# Patient Record
Sex: Male | Born: 1985 | Race: White | Hispanic: No | Marital: Married | Smoking: Never smoker
Health system: Southern US, Community
[De-identification: ages and names within clinical notes are randomized; demographics above are authoritative.]

---

## 2011-05-23 ENCOUNTER — Emergency Department (HOSPITAL_COMMUNITY)
Admission: EM | Admit: 2011-05-23 | Discharge: 2011-05-23 | Disposition: A | Discharge status: Home / Self Care | Payer: Self-pay | Attending: Emergency Medicine | Admitting: Emergency Medicine

## 2011-05-23 ENCOUNTER — Encounter: Payer: Self-pay | Admitting: *Deleted

## 2011-05-23 DIAGNOSIS — R229 Localized swelling, mass and lump, unspecified: Secondary | ICD-10-CM | POA: Insufficient documentation

## 2011-05-23 DIAGNOSIS — L03019 Cellulitis of unspecified finger: Secondary | ICD-10-CM | POA: Insufficient documentation

## 2011-05-23 DIAGNOSIS — IMO0002 Reserved for concepts with insufficient information to code with codable children: Secondary | ICD-10-CM

## 2011-05-23 DIAGNOSIS — M79609 Pain in unspecified limb: Secondary | ICD-10-CM | POA: Insufficient documentation

## 2011-05-23 MED ORDER — IBUPROFEN 800 MG PO TABS
ORAL_TABLET | ORAL | Status: AC
  Administered 2011-05-23: 800 mg
  Filled 2011-05-23: qty 1

## 2011-05-23 NOTE — ED Provider Notes (Signed)
Medical screening examination/treatment/procedure(s) were performed by non-physician practitioner and as supervising physician I was immediately available for consultation/collaboration.  Reneka Nebergall M Cilicia Borden, MD 05/23/11 0726 

## 2011-05-23 NOTE — ED Provider Notes (Signed)
History     CSN: [id number] Arrival date & time: 05/23/2011  2:04 AM   First MD Initiated Contact with Patient 05/23/11 0207      Chief Complaint  Patient presents with  . Finger Injury    HPI  History provided by the patient and significant other. Patient complains of swelling and pain around the nail of his right index finger that began earlier today. The patient denies similar symptoms in the past. Symptoms came on gradually throughout the day. Pain is a constant ache that is worse with palpation or pressure. Patient has not taken or done anything for the pain. Patient denies drainage or bleeding. Patient does admit to biting fingernails. She has no other significant past medical history.   History reviewed. No pertinent past medical history.  History reviewed. No pertinent past surgical history.  History reviewed. No pertinent family history.  History  Substance Use Topics  . Smoking status: Never Smoker   . Smokeless tobacco: Not on file  . Alcohol Use: No      Review of Systems  Constitutional: Negative for fever and chills.  All other systems reviewed and are negative.    Allergies  Review of patient's allergies indicates no known allergies.  Home Medications   Current Outpatient Rx  Name Route Sig Dispense Refill  . IBUPROFEN 200 MG PO TABS Oral Take 800 mg by mouth every 6 (six) hours as needed. For pain       BP 148/84  Pulse 110  Temp(Src) 97.7 F (36.5 C) (Oral)  Resp 18  SpO2 97%  Physical Exam  Nursing note and vitals reviewed. Constitutional: He is oriented to person, place, and time. He appears well-developed and well-nourished. No distress.  HENT:  Head: Normocephalic.  Cardiovascular: Normal rate, regular rhythm and normal heart sounds.   Pulmonary/Chest: Effort normal.  Musculoskeletal:       Paronychia about right index finger. Range of motion of finger no erythematous streaks or circumferential swelling. No pain or tenderness along  the flexor surface of finger.  Neurological: He is alert and oriented to person, place, and time.  Skin: Skin is warm. No rash noted.  Psychiatric: He has a normal mood and affect. His behavior is normal.    ED Course  Procedures (including critical care time)  INCISION AND DRAINAGE Performed by: Angus Seller Consent: Verbal consent obtained. Risks and benefits: risks, benefits and alternatives were discussed Type: Paronychia   Body area: Right index finger  Anesthesia: None  Procedure: I&D with 18-gauge needle   Drainage: purulent  Patient tolerance: Patient tolerated the procedure well with no immediate complications.     1. Paronychia       MDM  Patient seen and evaluated. Patient in no acute distress. HM presents with paronychia of right index finger. Paronychia was drained with 18-gauge needle successfully. Patient encouraged to use warm soaks and gentle massage at home. We'll discharge at this time.       Angus Seller, [place] 05/23/11 [phone]

## 2011-05-23 NOTE — ED Notes (Signed)
Patient with right index finger infection at the nail bed

## 2014-10-22 ENCOUNTER — Encounter (HOSPITAL_COMMUNITY): Payer: Self-pay

## 2014-10-22 ENCOUNTER — Emergency Department (HOSPITAL_COMMUNITY): Payer: BLUE CROSS/BLUE SHIELD

## 2014-10-22 ENCOUNTER — Emergency Department (HOSPITAL_COMMUNITY)
Admission: EM | Admit: 2014-10-22 | Discharge: 2014-10-22 | Disposition: A | Discharge status: Home / Self Care | Payer: BLUE CROSS/BLUE SHIELD | Attending: Emergency Medicine | Admitting: Emergency Medicine

## 2014-10-22 DIAGNOSIS — Y998 Other external cause status: Secondary | ICD-10-CM | POA: Insufficient documentation

## 2014-10-22 DIAGNOSIS — S0990XA Unspecified injury of head, initial encounter: Secondary | ICD-10-CM | POA: Diagnosis present

## 2014-10-22 DIAGNOSIS — Y9389 Activity, other specified: Secondary | ICD-10-CM | POA: Diagnosis not present

## 2014-10-22 DIAGNOSIS — Y9289 Other specified places as the place of occurrence of the external cause: Secondary | ICD-10-CM | POA: Diagnosis not present

## 2014-10-22 DIAGNOSIS — W208XXA Other cause of strike by thrown, projected or falling object, initial encounter: Secondary | ICD-10-CM | POA: Diagnosis not present

## 2014-10-22 DIAGNOSIS — F0781 Postconcussional syndrome: Secondary | ICD-10-CM

## 2014-10-22 DIAGNOSIS — S060X9A Concussion with loss of consciousness of unspecified duration, initial encounter: Secondary | ICD-10-CM | POA: Diagnosis not present

## 2014-10-22 DIAGNOSIS — S0001XA Abrasion of scalp, initial encounter: Secondary | ICD-10-CM | POA: Diagnosis not present

## 2014-10-22 MED ORDER — NAPROXEN 500 MG PO TABS: 500.0000 mg | ORAL_TABLET | Freq: Two times a day (BID) | ORAL | Status: AC

## 2014-10-22 MED ORDER — ONDANSETRON 8 MG PO TBDP: 8.0000 mg | ORAL_TABLET | Freq: Three times a day (TID) | ORAL | Status: DC | PRN

## 2014-10-22 MED ORDER — IBUPROFEN 400 MG PO TABS
600.0000 mg | ORAL_TABLET | Freq: Once | ORAL | Status: AC
  Administered 2014-10-22: 600 mg via ORAL
  Filled 2014-10-22 (×2): qty 1

## 2014-10-22 NOTE — ED Notes (Signed)
Pt here with wife. Pt was hit in the head yesterday by a piece of wood after trying to sit in a hammock that broke. Pt had an episode today that he does not recall. Had left for work and ending up being pulled over by cop because he was speeding and wasn't aware of it. Pt got to work and called wife and told her. Pt had episode of vomiting at 1615 this afternoon also. Wife states yesterday there was a lot of blood when he got hit. Pt reports a lot waves of nausea.

## 2014-10-22 NOTE — ED Notes (Signed)
Discussed with PA; orders received. Family updated.

## 2014-10-22 NOTE — Discharge Instructions (Signed)
You likely have concussion. Please read the instruction below. COMPLETE rest for the next 2 days advised, and then avoid any activity that can cause head trauma.   Concussion A concussion, or closed-head injury, is a brain injury caused by a direct blow to the head or by a quick and sudden movement (jolt) of the head or neck. Concussions are usually not life-threatening. Even so, the effects of a concussion can be serious. If you have had a concussion before, you are more likely to experience concussion-like symptoms after a direct blow to the head.  CAUSES  Direct blow to the head, such as from running into another player during a soccer game, being hit in a fight, or hitting your head on a hard surface.  A jolt of the head or neck that causes the brain to move back and forth inside the skull, such as in a car crash. SIGNS AND SYMPTOMS The signs of a concussion can be hard to notice. Early on, they may be missed by you, family members, and health care providers. You may look fine but act or feel differently. Symptoms are usually temporary, but they may last for days, weeks, or even longer. Some symptoms may appear right away while others may not show up for hours or days. Every head injury is different. Symptoms include:  Mild to moderate headaches that will not go away.  A feeling of pressure inside your head.  Having more trouble than usual:  Learning or remembering things you have heard.  Answering questions.  Paying attention or concentrating.  Organizing daily tasks.  Making decisions and solving problems.  Slowness in thinking, acting or reacting, speaking, or reading.  Getting lost or being easily confused.  Feeling tired all the time or lacking energy (fatigued).  Feeling drowsy.  Sleep disturbances.  Sleeping more than usual.  Sleeping less than usual.  Trouble falling asleep.  Trouble sleeping (insomnia).  Loss of balance or feeling lightheaded or  dizzy.  Nausea or vomiting.  Numbness or tingling.  Increased sensitivity to:  Sounds.  Lights.  Distractions.  Vision problems or eyes that tire easily.  Diminished sense of taste or smell.  Ringing in the ears.  Mood changes such as feeling sad or anxious.  Becoming easily irritated or angry for little or no reason.  Lack of motivation.  Seeing or hearing things other people do not see or hear (hallucinations). DIAGNOSIS Your health care provider can usually diagnose a concussion based on a description of your injury and symptoms. He or she will ask whether you passed out (lost consciousness) and whether you are having trouble remembering events that happened right before and during your injury. Your evaluation might include:  A brain scan to look for signs of injury to the brain. Even if the test shows no injury, you may still have a concussion.  Blood tests to be sure other problems are not present. TREATMENT  Concussions are usually treated in an emergency department, in urgent care, or at a clinic. You may need to stay in the hospital overnight for further treatment.  Tell your health care provider if you are taking any medicines, including prescription medicines, over-the-counter medicines, and natural remedies. Some medicines, such as blood thinners (anticoagulants) and aspirin, may increase the chance of complications. Also tell your health care provider whether you have had alcohol or are taking illegal drugs. This information may affect treatment.  Your health care provider will send you home with important instructions to follow.  How fast you will recover from a concussion depends on many factors. These factors include how severe your concussion is, what part of your brain was injured, your age, and how healthy you were before the concussion.  Most people with mild injuries recover fully. Recovery can take time. In general, recovery is slower in older persons.  Also, persons who have had a concussion in the past or have other medical problems may find that it takes longer to recover from their current injury. HOME CARE INSTRUCTIONS General Instructions  Carefully follow the directions your health care provider gave you.  Only take over-the-counter or prescription medicines for pain, discomfort, or fever as directed by your health care provider.  Take only those medicines that your health care provider has approved.  Do not drink alcohol until your health care provider says you are well enough to do so. Alcohol and certain other drugs may slow your recovery and can put you at risk of further injury.  If it is harder than usual to remember things, write them down.  If you are easily distracted, try to do one thing at a time. For example, do not try to watch TV while fixing dinner.  Talk with family members or close friends when making important decisions.  Keep all follow-up appointments. Repeated evaluation of your symptoms is recommended for your recovery.  Watch your symptoms and tell others to do the same. Complications sometimes occur after a concussion. Older adults with a brain injury may have a higher risk of serious complications, such as a blood clot on the brain.  Tell your teachers, school nurse, school counselor, coach, athletic trainer, or work Freight forwarder about your injury, symptoms, and restrictions. Tell them about what you can or cannot do. They should watch for:  Increased problems with attention or concentration.  Increased difficulty remembering or learning new information.  Increased time needed to complete tasks or assignments.  Increased irritability or decreased ability to cope with stress.  Increased symptoms.  Rest. Rest helps the brain to heal. Make sure you:  Get plenty of sleep at night. Avoid staying up late at night.  Keep the same bedtime hours on weekends and weekdays.  Rest during the day. Take daytime  naps or rest breaks when you feel tired.  Limit activities that require a lot of thought or concentration. These include:  Doing homework or job-related work.  Watching TV.  Working on the computer.  Avoid any situation where there is potential for another head injury (football, hockey, soccer, basketball, martial arts, downhill snow sports and horseback riding). Your condition will get worse every time you experience a concussion. You should avoid these activities until you are evaluated by the appropriate follow-up health care providers. Returning To Your Regular Activities You will need to return to your normal activities slowly, not all at once. You must give your body and brain enough time for recovery.  Do not return to sports or other athletic activities until your health care provider tells you it is safe to do so.  Ask your health care provider when you can drive, ride a bicycle, or operate heavy machinery. Your ability to react may be slower after a brain injury. Never do these activities if you are dizzy.  Ask your health care provider about when you can return to work or school. Preventing Another Concussion It is very important to avoid another brain injury, especially before you have recovered. In rare cases, another injury can lead to permanent  brain damage, brain swelling, or death. The risk of this is greatest during the first 7-10 days after a head injury. Avoid injuries by:  Wearing a seat belt when riding in a car.  Drinking alcohol only in moderation.  Wearing a helmet when biking, skiing, skateboarding, skating, or doing similar activities.  Avoiding activities that could lead to a second concussion, such as contact or recreational sports, until your health care provider says it is okay.  Taking safety measures in your home.  Remove clutter and tripping hazards from floors and stairways.  Use grab bars in bathrooms and handrails by stairs.  Place non-slip  mats on floors and in bathtubs.  Improve lighting in dim areas. SEEK MEDICAL CARE IF:  You have increased problems paying attention or concentrating.  You have increased difficulty remembering or learning new information.  You need more time to complete tasks or assignments than before.  You have increased irritability or decreased ability to cope with stress.  You have more symptoms than before. Seek medical care if you have any of the following symptoms for more than 2 weeks after your injury:  Lasting (chronic) headaches.  Dizziness or balance problems.  Nausea.  Vision problems.  Increased sensitivity to noise or light.  Depression or mood swings.  Anxiety or irritability.  Memory problems.  Difficulty concentrating or paying attention.  Sleep problems.  Feeling tired all the time. SEEK IMMEDIATE MEDICAL CARE IF:  You have severe or worsening headaches. These may be a sign of a blood clot in the brain.  You have weakness (even if only in one hand, leg, or part of the face).  You have numbness.  You have decreased coordination.  You vomit repeatedly.  You have increased sleepiness.  One pupil is larger than the other.  You have convulsions.  You have slurred speech.  You have increased confusion. This may be a sign of a blood clot in the brain.  You have increased restlessness, agitation, or irritability.  You are unable to recognize people or places.  You have neck pain.  It is difficult to wake you up.  You have unusual behavior changes.  You lose consciousness. MAKE SURE YOU:  Understand these instructions.  Will watch your condition.  Will get help right away if you are not doing well or get worse. Document Released: 09/04/2003 Document Revised: 06/19/2013 Document Reviewed: 01/04/2013 [facility] Patient Information 2015 [place], [place]. This information is not intended to replace advice given to you by your health care provider. Make  sure you discuss any questions you have with your health care provider.

## 2014-10-25 NOTE — ED Provider Notes (Signed)
CSN: [id number]     Arrival date & time 10/22/14  1718 History   First MD Initiated Contact with Patient 10/22/14 2019     Chief Complaint  Patient presents with  . Concussion     (Consider location/radiation/quality/duration/timing/severity/associated sxs/prior Treatment) HPI Comments: Pt comes in with cc of memory loss. Report having no medical hx. States that he was on a hammock yday, fell down, and the wooden post hit him on the scalp. Pt had no LOC, nausea, vision complains, balance problems. He however reports some sluggishness since then, and intermittent headache. Also, he is having some difficulty concentrating and remembering. Pt for e.g was pulled over by Police for speeding, he couldn't believe he was speeding that fast and moreover he doesn't recall the drive itself. No neck pain. No fevers. No drug use.   The history is provided by the patient.    History reviewed. No pertinent past medical history. History reviewed. No pertinent past surgical history. No family history on file. History  Substance Use Topics  . Smoking status: Never Smoker   . Smokeless tobacco: Not on file  . Alcohol Use: No    Review of Systems  Constitutional: Positive for activity change.  Eyes: Negative for photophobia and visual disturbance.  Gastrointestinal: Positive for nausea.  Neurological: Positive for headaches. Negative for numbness.      Allergies  Review of patient's allergies indicates no known allergies.  Home Medications   Prior to Admission medications   Medication Sig Start Date End Date Taking? Authorizing Provider  acetaminophen (TYLENOL) 500 MG tablet Take 1,000 mg by mouth every 6 (six) hours as needed for mild pain.   Yes Historical Provider, MD  ibuprofen (ADVIL,MOTRIN) 200 MG tablet Take 800 mg by mouth every 6 (six) hours as needed. For pain    Yes Historical Provider, MD  naproxen (NAPROSYN) 500 MG tablet Take 1 tablet (500 mg total) by mouth 2 (two) times daily.  10/22/14   Derwood Kaplan, MD  ondansetron (ZOFRAN ODT) 8 MG disintegrating tablet Take 1 tablet (8 mg total) by mouth every 8 (eight) hours as needed for nausea. 10/22/14   Raihan Kimmel Rhunette Croft, MD   BP 129/72 mmHg  Pulse 69  Temp(Src) 98.4 F (36.9 C) (Oral)  Resp 18  Ht  (1.803 m)  Wt 220 lb (99.791 kg)  BMI 30.70 kg/m2  SpO2 97% Physical Exam  Constitutional: He is oriented to person, place, and time. He appears well-developed and well-nourished.  HENT:  Head: Normocephalic and atraumatic.  Right scalp abrasion  Eyes: EOM are normal. Pupils are equal, round, and reactive to light.  Neck: Normal range of motion. Neck supple. No JVD present.  Cardiovascular: Normal rate and regular rhythm.   Pulmonary/Chest: Effort normal and breath sounds normal. No respiratory distress. He has no wheezes.  Abdominal: Soft. Bowel sounds are normal. He exhibits no distension. There is no tenderness. There is no rebound and no guarding.  Neurological: He is alert and oriented to person, place, and time. No cranial nerve deficit. Coordination normal.   No objective sensory deficits, Motor strength upper and lower extremity 4+ and equal Normal cerebellar exam  Skin: Skin is warm and dry.  Vitals reviewed.   ED Course  Procedures (including critical care time) Labs Review Labs Reviewed - No data to display  Imaging Review No results found.   EKG Interpretation None      MDM   Final diagnoses:  Concussion syndrome    Pt with some  concentration deficits, anterograde amnesia. Blunt trauma to the head yday. CT head is neg. No infection on hx and exam. VSS and WNL. Will treat as concussion syndrome, and concerns for concussion discussed.    Derwood KaplanAnkit Halimah Bewick, MD 10/25/14 [phone]

## 2015-09-12 ENCOUNTER — Encounter: Payer: Self-pay | Admitting: Emergency Medicine

## 2015-09-12 ENCOUNTER — Emergency Department (INDEPENDENT_AMBULATORY_CARE_PROVIDER_SITE_OTHER)
Admission: EM | Admit: 2015-09-12 | Discharge: 2015-09-12 | Disposition: A | Discharge status: Home / Self Care | Payer: BLUE CROSS/BLUE SHIELD | Source: Home / Self Care | Attending: Family Medicine | Admitting: Family Medicine

## 2015-09-12 DIAGNOSIS — H6123 Impacted cerumen, bilateral: Secondary | ICD-10-CM

## 2015-09-12 DIAGNOSIS — J069 Acute upper respiratory infection, unspecified: Secondary | ICD-10-CM

## 2015-09-12 DIAGNOSIS — B9789 Other viral agents as the cause of diseases classified elsewhere: Secondary | ICD-10-CM

## 2015-09-12 LAB — POCT RAPID STREP A (OFFICE): RAPID STREP A SCREEN: NEGATIVE

## 2015-09-12 MED ORDER — BENZONATATE 200 MG PO CAPS: 200.0000 mg | ORAL_CAPSULE | Freq: Every day | ORAL | Status: DC

## 2015-09-12 MED ORDER — AMOXICILLIN 875 MG PO TABS: 875.0000 mg | ORAL_TABLET | Freq: Two times a day (BID) | ORAL | Status: DC

## 2015-09-12 NOTE — Discharge Instructions (Signed)
Take plain guaifenesin (1200mg  extended release tabs such as Mucinex) twice daily, with plenty of water, for cough and congestion.  May add Pseudoephedrine (30mg , one or two every 4 to 6 hours) for sinus congestion.  Get adequate rest.   May use Afrin nasal spray (or generic oxymetazoline) twice daily for about 5 days and then discontinue.  Also recommend using saline nasal spray several times daily and saline nasal irrigation (AYR is a common brand).  Use Flonase nasal spray each morning after using Afrin nasal spray and saline nasal irrigation. Try warm salt water gargles for sore throat.  Stop all antihistamines for now, and other non-prescription cough/cold preparations. May take Ibuprofen 200mg , 4 tabs every 8 hours with food for sore throat, body aches, etc. May take benzonatate (Tessalon) at night as needed for cough. Follow-up with family doctor if not improving about 7 to10 days.

## 2015-09-12 NOTE — ED Provider Notes (Signed)
CSN: [id number]     Arrival date & time 09/12/15  1002 History   First MD Initiated Contact with Patient 09/12/15 1049     Chief Complaint  Patient presents with  . Cerumen Impaction      HPI Comments: Patient complains of ears feeling blocked with increased sinus congestion for two days.   The history is provided by the patient.    History reviewed. No pertinent past medical history. History reviewed. No pertinent past surgical history. No family history on file. Social History  Substance Use Topics  . Smoking status: Never Smoker   . Smokeless tobacco: None  . Alcohol Use: No    Review of Systems + sore throat + cough + sneezing No pleuritic pain No wheezing + nasal congestion + post-nasal drainage No sinus pain/pressure No itchy/red eyes ? Earache; ears feel clogged No hemoptysis No SOB No fever, + chills No nausea No vomiting No abdominal pain No diarrhea No urinary symptoms No skin rash + fatigue No myalgias No headache Used OTC meds without relief  Allergies  Review of patient's allergies indicates no known allergies.  Home Medications   Prior to Admission medications   Medication Sig Start Date End Date Taking? Authorizing Provider  acetaminophen (TYLENOL) 500 MG tablet Take 1,000 mg by mouth every 6 (six) hours as needed for mild pain.    Historical Provider, MD  amoxicillin (AMOXIL) 875 MG tablet Take 1 tablet (875 mg total) by mouth 2 (two) times daily. 09/12/15   Lattie HawStephen A Simcha Farrington, MD  benzonatate (TESSALON) 200 MG capsule Take 1 capsule (200 mg total) by mouth at bedtime. Take as needed for cough 09/12/15   Lattie HawStephen A Eulas Schweitzer, MD  ibuprofen (ADVIL,MOTRIN) 200 MG tablet Take 800 mg by mouth every 6 (six) hours as needed. For pain     Historical Provider, MD  naproxen (NAPROSYN) 500 MG tablet Take 1 tablet (500 mg total) by mouth 2 (two) times daily. 10/22/14   Derwood KaplanAnkit Nanavati, MD  ondansetron (ZOFRAN ODT) 8 MG disintegrating tablet Take 1 tablet (8 mg  total) by mouth every 8 (eight) hours as needed for nausea. 10/22/14   Derwood KaplanAnkit Nanavati, MD   Meds Ordered and Administered this Visit  Medications - No data to display  BP 136/89 mmHg  Pulse 70  Temp(Src) 97.6 F (36.4 C) (Oral)  Ht 5\' 11"  (1.803 m)  Wt 243 lb (110.224 kg)  BMI 33.91 kg/m2  SpO2 97% No data found.   Physical Exam Nursing notes and Vital Signs reviewed. Appearance:  Patient appears stated age, and in no acute distress.  Patient is obese (BMI 33.9) Eyes:  Pupils are equal, round, and reactive to light and accomodation.  Extraocular movement is intact.  Conjunctivae are not inflamed  Ears:  Canals occluded with cerumen.  Post lavage, right tympanic membrane slightly erythematous.  Nose:  Mildly congested turbinates.  No sinus tenderness.    Pharynx:  Normal Neck:  Supple.  Tender bilateral tonsillar nodes.  Enlarged posterior nodes are palpated bilaterally  Lungs:  Clear to auscultation.  Breath sounds are equal.  Moving air well. Heart:  Regular rate and rhythm without murmurs, rubs, or gallops.  Abdomen:  Nontender without masses or hepatosplenomegaly.  Bowel sounds are present.  No CVA or flank tenderness.  Extremities:  No edema.  Skin:  No rash present.   ED Course  Procedures bilateral cerumen removal by nurse    Labs Reviewed -  POCT rapid strep test negative  Tympanogram:  Left ear low peak height, large ear volume.  Right ear low peak height, large ear volume.    MDM   1. Cerumen impaction, bilateral; ?otitis media  2. Viral URI with cough    Begin amoxicillin. Take plain guaifenesin (  extended release tabs such as Mucinex) twice daily, with plenty of water, for cough and congestion.  May add Pseudoephedrine ( , one or two every 4 to 6 hours) for sinus congestion.  Get adequate rest.   May use Afrin nasal spray (or generic oxymetazoline) twice daily for about 5 days and then discontinue.  Also recommend using saline nasal spray several times  daily and saline nasal irrigation (AYR is a common brand).  Use Flonase nasal spray each morning after using Afrin nasal spray and saline nasal irrigation. Try warm salt water gargles for sore throat.  Stop all antihistamines for now, and other non-prescription cough/cold preparations. May take Ibuprofen , 4 tabs every 8 hours with food for sore throat, body aches, etc. May take benzonatate (Tessalon) at night as needed for cough. Follow-up with family doctor if not improving about 7 to10 days.     Lattie Haw, MD 09/16/15 2127

## 2015-09-12 NOTE — ED Notes (Signed)
Cerumen impaction, congestion x 2 days

## 2016-05-13 IMAGING — CT CT HEAD W/O CM
1 series · 16 of 30 positions shown, 20 images · non-contrast
Comparison: None

CLINICAL DATA: Head injury, fall, vomiting, concussion.

EXAM:
CT HEAD WITHOUT CONTRAST
TECHNIQUE: Contiguous axial images were obtained from the base of the skull
through the vertex without contrast.

[Series 2: head 5.0 h30s · axial · 0.47mm/px · z∈[-150,-0]mm · 16 of 34 slices shown, 20 images]
[im 2/34  brain]
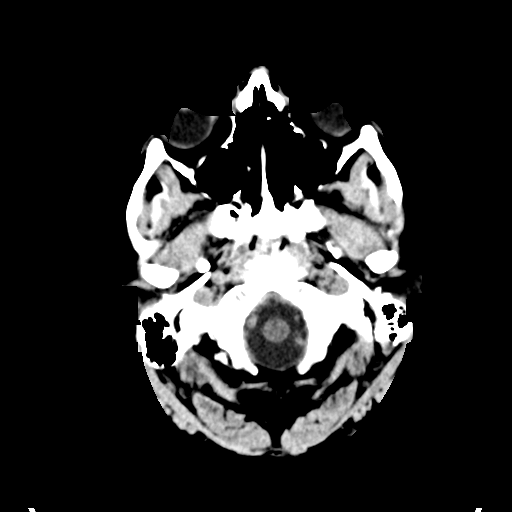
[im 2/34  bone]
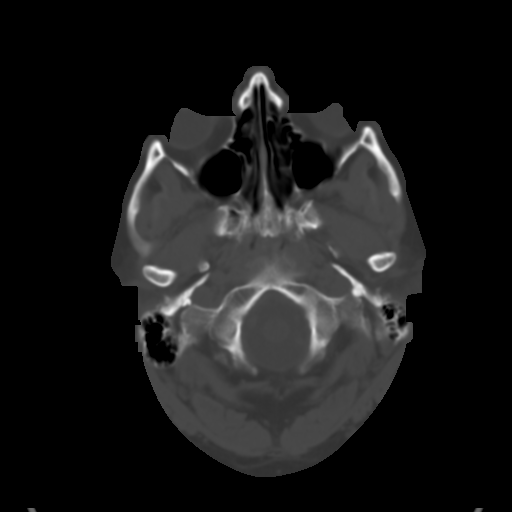
[im 4/34  brain]
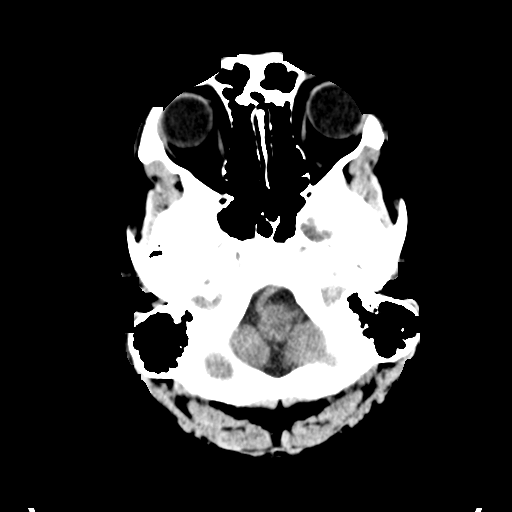
[im 6/34  brain]
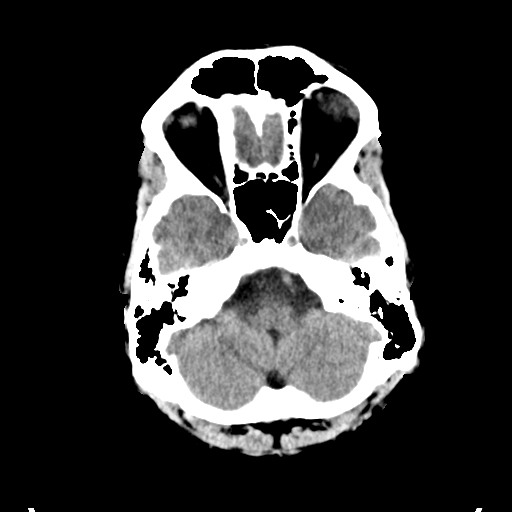
[im 8/34  brain]
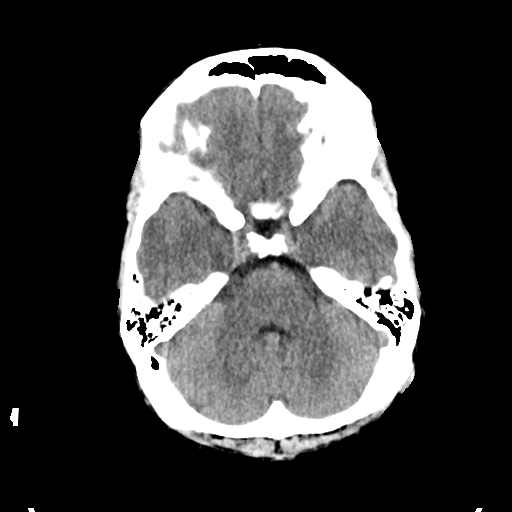
[im 10/34  brain]
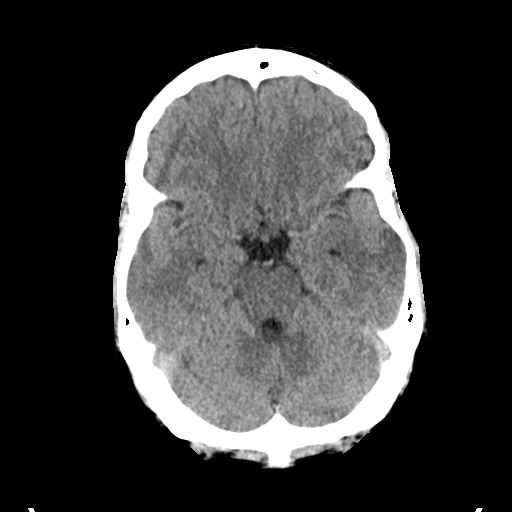
[im 10/34  bone]
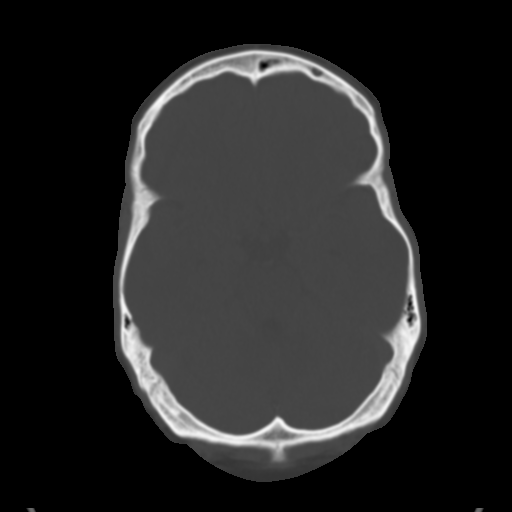
[im 12/34  brain]
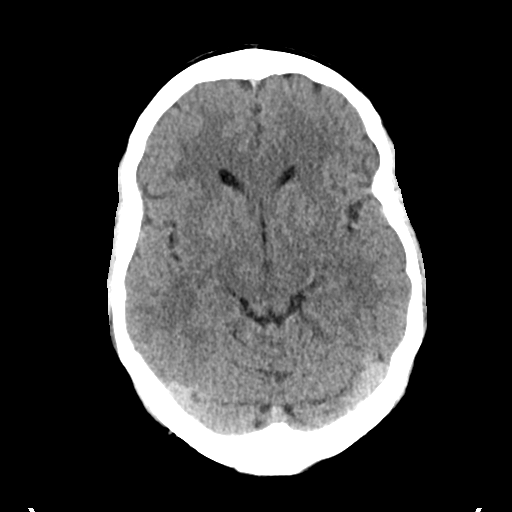
[im 14/34  brain]
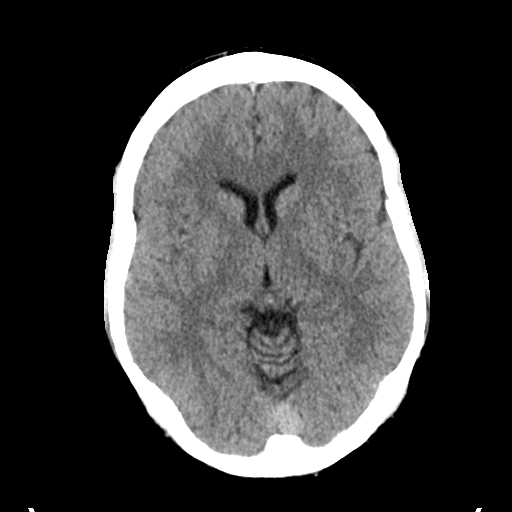
[im 16/34  brain]
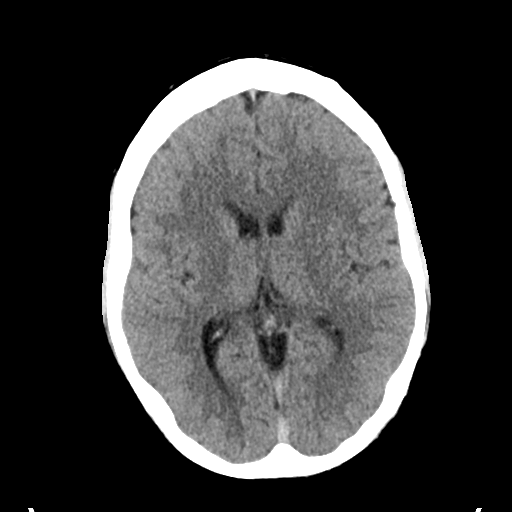
[im 18/34  brain]
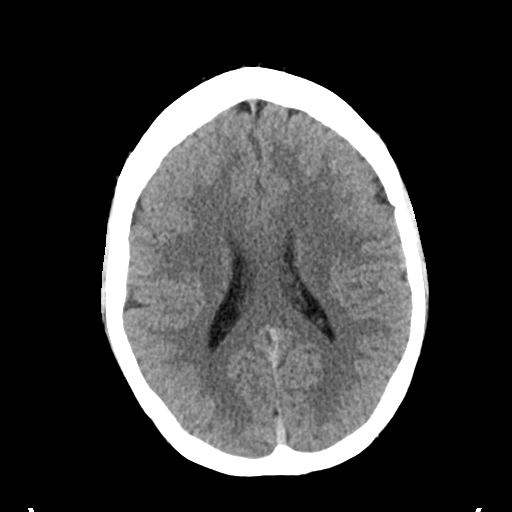
[im 18/34  bone]
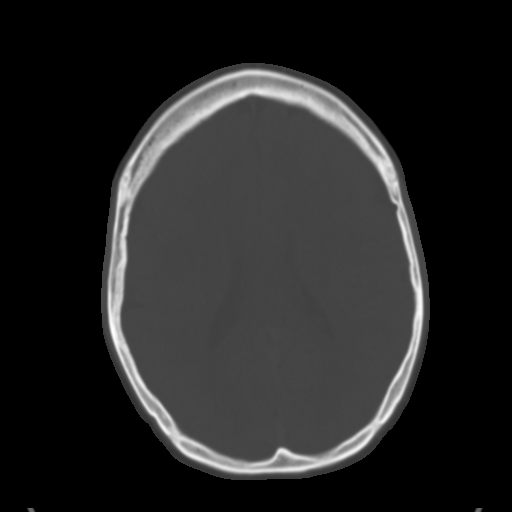
[im 20/34  brain]
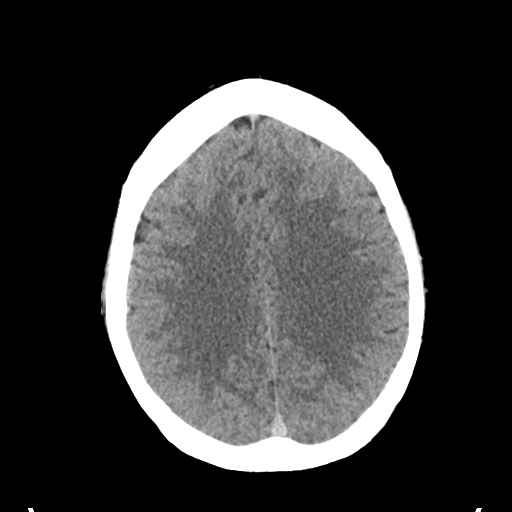
[im 22/34  brain]
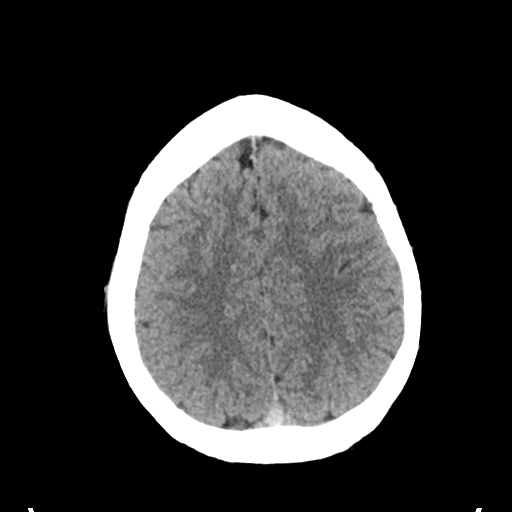
[im 24/34  brain]
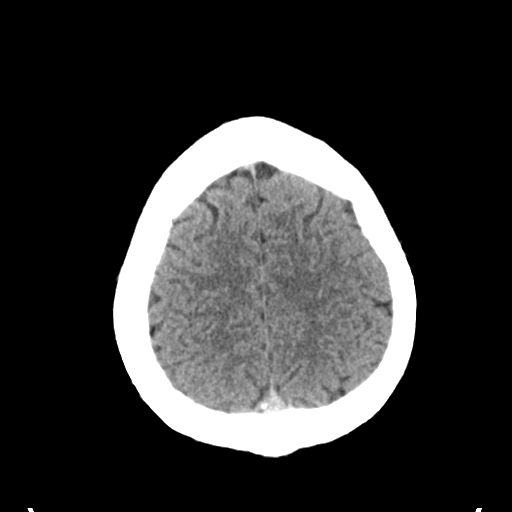
[im 26/34  brain]
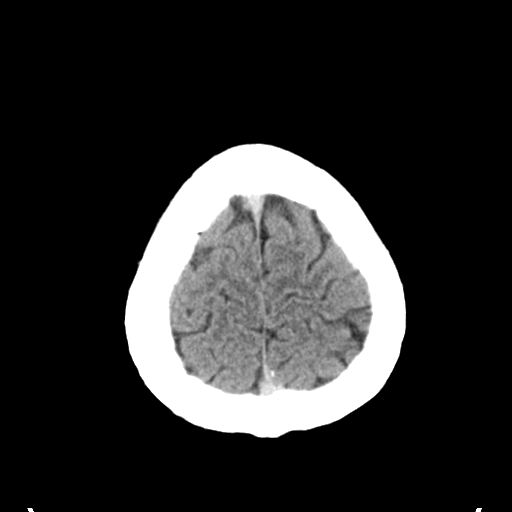
[im 26/34  bone]
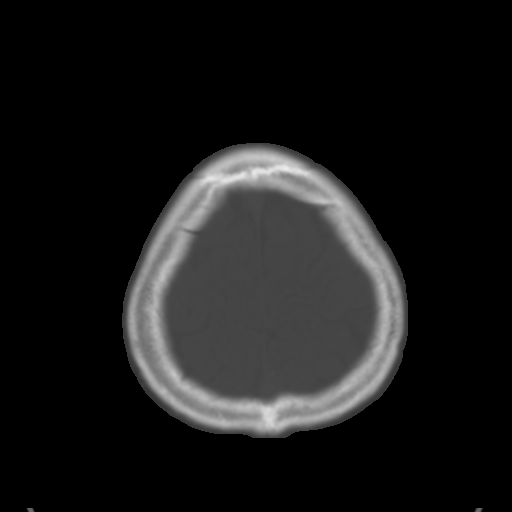
[im 28/34  brain]
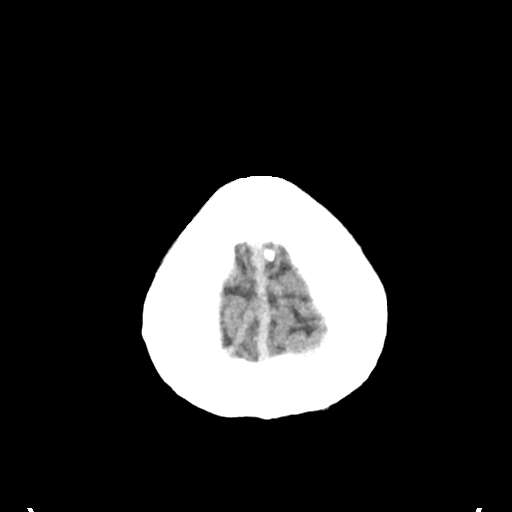
[im 30/34  brain]
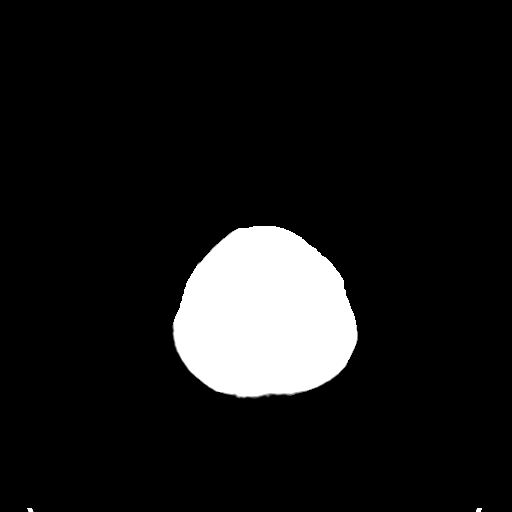
[im 32/34  brain]
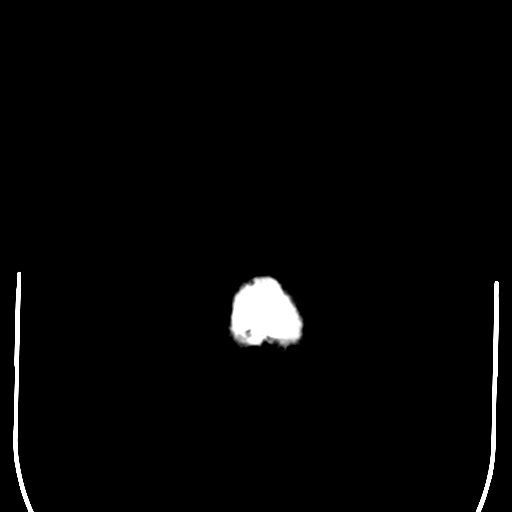

[16 of 30 positions shown; findings below may reference images not displayed]

FINDINGS: Normal appearance of the intracranial structures. No evidence for
acute hemorrhage, mass lesion, midline shift, hydrocephalus or large
infarct. No acute bony abnormality. The visualized sinuses are
clear. Cerumen impaction of the external auditory canals
bilaterally.
IMPRESSION: No acute intracranial abnormality.

## 2017-04-28 ENCOUNTER — Ambulatory Visit (HOSPITAL_COMMUNITY)
Admission: AD | Admit: 2017-04-28 | Discharge: 2017-04-28 | Disposition: A | Discharge status: Home / Self Care | Payer: BLUE CROSS/BLUE SHIELD | Attending: Psychiatry | Admitting: Psychiatry

## 2017-04-28 DIAGNOSIS — F329 Major depressive disorder, single episode, unspecified: Secondary | ICD-10-CM | POA: Insufficient documentation

## 2017-04-28 NOTE — BH Assessment (Signed)
Assessment Note  Pedro Harper is an 31 y.o. male presenting to [facility] with his mother seeking help for depression and recent suicidal thoughts. The patient reports recent conflict with wife and stress at work. The patient and his wife had an argument 2 days ago, she would not return his calls and was not home when he returned from work. The patient considered SI with plan to drive his car into something. This was a fleeting thought. His wife had gone her parents home and get away. Due to SI thoughts the patient went his parents home where he has been since then. The patient had prior SI about 6 months ago and had HI toward his wife at that time. States he never told anyone about depressive symptoms in the past and felt shame over HI thoughts. Denies current SI, HI or A/V.   The patient indicates depressive symptoms for 2.5 yrs and SI on and off for the last year. No prior inpatient or outpatient treatment. The patient denies prior SI attempts. Admits to symptoms of irritability and isolation. Plans to remain with parents for now who are supportive. The patient had depressed mood and affect, disheveled appearance, poor eye contact, freedom of movement, moderate anxiety, partial judgement, poor insight and fair impulse control.  Patient agreed for mother to participate in the discussion of a safety plan at home and treatment options. Mother confirms all fire arms are secure. Can drive patient to PHP. Patient contracts for safety and agrees to attend PHP tomorrow at 8am.   Assunta Found NP and Dr. Lucianne Muss recommends PHP with safety plan.   Diagnosis: MDD  Past Medical History: No past medical history on file.  No past surgical history on file.  Family History: No family history on file.  Social History:  reports that he has never smoked. He does not have any smokeless tobacco history on file. He reports that he does not drink alcohol or use drugs.  Additional Social History:  Alcohol / Drug Use Pain  Medications: see MAR  Prescriptions: see MAR Over the Counter: see MAR History of alcohol / drug use?: No history of alcohol / drug abuse  CIWA: CIWA-Ar BP: 131/82 Pulse Rate: 79 COWS:    Allergies: No Known Allergies  Home Medications:  (Not in a hospital admission)  OB/GYN Status:  No LMP for male patient.  General Assessment Data Location of Assessment: [facility] ED TTS Assessment: In system Is this a Tele or Face-to-Face Assessment?: Face-to-Face Is this an Initial Assessment or a Re-assessment for this encounter?: Initial Assessment Marital status: Single Maiden name: n/a Is patient pregnant?: No Pregnancy Status: No Living Arrangements: Parent Can pt return to current living arrangement?: Yes Admission Status: Voluntary Is patient capable of signing voluntary admission?: Yes Referral Source: Self/Family/Friend Insurance type: Scientist, research (physical sciences) Exam [facility] Walk-in ONLY) Medical Exam completed: Yes  Crisis Care Plan Living Arrangements: Parent Legal Guardian:  (n/a) Name of Psychiatrist: n/a Name of Therapist: n/a   Education Status Is patient currently in school?: No  Risk to self with the past 6 months Suicidal Ideation: No Has patient been a risk to self within the past 6 months prior to admission? : Yes (2 days ago) Suicidal Intent: No Has patient had any suicidal intent within the past 6 months prior to admission? : No Is patient at risk for suicide?: No Suicidal Plan?: No Has patient had any suicidal plan within the past 6 months prior to admission? : Yes Access to Means: Yes  Specify Access to Suicidal Means: plan to drive care into something, has a car What has been your use of drugs/alcohol within the last 12 months?: n/a Previous Attempts/Gestures: No How many times?: 0 Other Self Harm Risks: 0 Triggers for Past Attempts: None known Intentional Self Injurious Behavior: None Family Suicide History: Unknown Recent stressful life event(s): Conflict  (Comment) (marital problems, work stress) Persecutory voices/beliefs?: No Depression: Yes Depression Symptoms: Despondent, Feeling worthless/self pity Substance abuse history and/or treatment for substance abuse?: No Suicide prevention information given to non-admitted patients: Not applicable  Risk to Others within the past 6 months Homicidal Ideation: No Does patient have any lifetime risk of violence toward others beyond the six months prior to admission? : No Thoughts of Harm to Others: No Current Homicidal Intent: No Current Homicidal Plan: No Access to Homicidal Means: No Identified Victim:  (n/a) History of harm to others?: No Assessment of Violence: None Noted Violent Behavior Description: n/a Does patient have access to weapons?: No Criminal Charges Pending?: No Does patient have a court date: No Is patient on probation?: No  Psychosis Hallucinations: None noted Delusions: None noted  Mental Status Report Appearance/Hygiene: Disheveled Eye Contact: Poor Motor Activity: Freedom of movement Speech: Logical/coherent Level of Consciousness: Alert Mood: Depressed Affect: Depressed Anxiety Level: Moderate Thought Processes: Coherent, Relevant Judgement: Partial Orientation: Person, Place, Time, Situation Obsessive Compulsive Thoughts/Behaviors: None  Cognitive Functioning Concentration: Normal Memory: Recent Intact, Remote Intact IQ: Average Insight: Poor Impulse Control: Fair Appetite: Poor Weight Loss: 0 Weight Gain: 0 Sleep: Decreased  ADLScreening [facility] Assessment Services) Patient's cognitive ability adequate to safely complete daily activities?: Yes Patient able to express need for assistance with ADLs?: Yes Independently performs ADLs?: Yes (appropriate for developmental age)  Prior Inpatient Therapy Prior Inpatient Therapy: No  Prior Outpatient Therapy Prior Outpatient Therapy: No Does patient have an ACCT team?: No Does patient have Intensive  In-House Services?  : No Does patient have [facility] services? : No Does patient have P4CC services?: No  ADL Screening (condition at time of admission) Patient's cognitive ability adequate to safely complete daily activities?: Yes Is the patient deaf or have difficulty hearing?: No Does the patient have difficulty seeing, even when wearing glasses/contacts?: No Does the patient have difficulty concentrating, remembering, or making decisions?: No Patient able to express need for assistance with ADLs?: Yes Does the patient have difficulty dressing or bathing?: No Independently performs ADLs?: Yes (appropriate for developmental age)       Abuse/Neglect Assessment (Assessment to be complete while patient is alone) Physical Abuse:  (UTA) Verbal Abuse:  (UTA) Sexual Abuse:  (UTA)     Advance Directives (For Healthcare) Does Patient Have a Medical Advance Directive?: No    Additional Information 1:1 In Past 12 Months?: No CIRT Risk: No Elopement Risk: No Does patient have medical clearance?: Yes     Disposition:  Disposition Initial Assessment Completed for this Encounter: Yes Disposition of Patient: Other dispositions Other disposition(s): Other (Comment)  On Site Evaluation by:   Reviewed with Physician:  Assunta FoundShuvon Rankin FNP and Dr. Rozann LeschesKumar  Glade Strausser H Braelyn Jenson 04/28/2017 11:53 PM
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
## 2017-04-29 ENCOUNTER — Other Ambulatory Visit (HOSPITAL_COMMUNITY): Payer: BLUE CROSS/BLUE SHIELD | Attending: Psychiatry | Admitting: Licensed Clinical Social Worker

## 2017-04-29 ENCOUNTER — Encounter (INDEPENDENT_AMBULATORY_CARE_PROVIDER_SITE_OTHER): Payer: Self-pay

## 2017-04-29 DIAGNOSIS — F332 Major depressive disorder, recurrent severe without psychotic features: Secondary | ICD-10-CM | POA: Diagnosis present

## 2017-04-29 DIAGNOSIS — R45851 Suicidal ideations: Secondary | ICD-10-CM | POA: Diagnosis not present
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
## 2017-05-02 ENCOUNTER — Other Ambulatory Visit (HOSPITAL_COMMUNITY): Payer: BLUE CROSS/BLUE SHIELD | Admitting: Licensed Clinical Social Worker

## 2017-05-02 DIAGNOSIS — F332 Major depressive disorder, recurrent severe without psychotic features: Secondary | ICD-10-CM | POA: Diagnosis not present

## 2017-05-02 NOTE — Psych (Signed)
Behavioral Health Partial Program Assessment Note  Date: 05/02/2017 Name: Pedro Harper MRN: [id number]  Chief Complaint: depression with some suicidal thoughts  Subjective: Pedro Harper says he has been depressed for about 2 years.  Stressors are his job and his marriage.  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  He was then watched more closely.  He switched to another store but the store manager is also new and he gets stuck doing things she should be doing.  The workers there are poorly trained and he has to pick up after them as well so that he is working harder with no extra pay.  The stress came into the marriage but his wife who had previously been supportive and easy to talk to was more distant and preoccupied with other friends and interests.  She has been diagnosed with bipolar disorder he said and has not been taking her meds.  He paid for prostitutes to talk to without the sex but nobody believes that he said.  When his wife found out she left and says it is over and he believes her.  He then moved in with his parents who are supportive.  Depression reached the point of daily sadness, periodic suicidal thinking without a wish to actually kill himself.  He tries not to think about things to not have the hopeless thoughts.  He has lost interest in things, worries a lot, has no energy or motivation and wants to avoid people and life.  No previous history with mental health providers.   HPI: Patient is a 31 y.o. Caucasian male presents with depression.  Patient was enrolled in partial psychiatric program on 05/02/17.  Primary complaints include: anxiety, depression worse, feeling suicidal, financial problems, increased irritability, poor concentration, relationship difficulties, stressed at work and tearfulness.  Onset of symptoms was gradual with gradually worsening course since that time. Psychosocial Stressors include the following:  marital and occupational.   I have reviewed the record of the ED visit and the history as presented by the patient  Complaints of Pain: nonear Past Psychiatric History:  This is the first time he has dealt with the mental health system  Currently in treatment with no one  Substance Abuse History: none Use of Alcohol: not an issue Use of Caffeine: other not an issue Use of over the counter: not an issue  No past surgical history on file.  No past medical history on file. Outpatient Encounter Medications as of 05/02/2017  Medication Sig  . acetaminophen (TYLENOL) 500 MG tablet Take 1,000 mg by mouth every 6 (six) hours as needed for mild pain.  Marland Kitchen. amoxicillin (AMOXIL) 875 MG tablet Take 1 tablet (875 mg total) by mouth 2 (two) times daily.  . benzonatate (TESSALON) 200 MG capsule Take 1 capsule (200 mg total) by mouth at bedtime. Take as needed for cough  . ibuprofen (ADVIL,MOTRIN) 200 MG tablet Take 800 mg by mouth every 6 (six) hours as needed. For pain   . naproxen (NAPROSYN) 500 MG tablet Take 1 tablet (500 mg total) by mouth 2 (two) times daily.  . ondansetron (ZOFRAN ODT) 8 MG disintegrating tablet Take 1 tablet (8 mg total) by mouth every 8 (eight) hours as needed for nausea.   No facility-administered encounter medications on file as of 05/02/2017.    No Known Allergies  Social History   Tobacco Use  . Smoking status: Never Smoker  Substance Use Topics  .  Alcohol use: No   Functioning Relationships: strained with spouse or significant others and good relationship with parents Education: College       Please specify degree: some college time Other Pertinent History: None No family history on file.   Review of Systems Constitutional: negative Eyes: negative Ears, nose, mouth, throat, and face: negative Respiratory: negative Cardiovascular: negative Gastrointestinal: negative Genitourinary: negative Integument/breast: negative Hematologic/lymphatic:  negative Musculoskeletal: negative Neurological: negative Behavioral/Psych: depression Endocrine: negative Allergic/Immunologic: negative  Objective:  There were no vitals filed for this visit.  Physical Exam: No exam performed today, no exam necessary.  Mental Status Exam: Appearance:  Casually dressed Psychomotor::  Within Normal Limits Attention span and concentration: Normal Behavior: calm, cooperative and adequate rapport can be established Speech:  normal pitch and normal volume Mood:  depressed Affect:  normal and mood-congruent Thought Process:  Coherent and Goal Directed Thought Content:  Logical Orientation:  person, place and time/date Cognition:  grossly intact Insight:  Intact Judgment:  Intact Estimate of Intelligence: Average Fund of knowledge: Aware of current events and Intact Memory: Recent and remote intact Abnormal movements: None Gait and station: Normal  Assessment:  Diagnosis: No primary diagnosis found. 1. Severe recurrent major depression without psychotic features [facility])     Indications for admission: inpatient care required if not in partial hospital program  Plan: patient enrolled in Partial Hospitalization Program  Treatment options and alternatives reviewed with patient and patient understands the above plan.   Comments: says he wants to be able to relax more and deal with his stressors without depression .    Carolanne Grumbling, MD

## 2017-05-03 ENCOUNTER — Other Ambulatory Visit (HOSPITAL_COMMUNITY): Payer: BLUE CROSS/BLUE SHIELD | Admitting: Licensed Clinical Social Worker

## 2017-05-03 ENCOUNTER — Encounter (HOSPITAL_COMMUNITY): Payer: Self-pay | Admitting: Licensed Clinical Social Worker

## 2017-05-03 ENCOUNTER — Other Ambulatory Visit (HOSPITAL_COMMUNITY): Payer: BLUE CROSS/BLUE SHIELD

## 2017-05-03 DIAGNOSIS — F332 Major depressive disorder, recurrent severe without psychotic features: Secondary | ICD-10-CM

## 2017-05-03 NOTE — Psych (Signed)
   [facility] THERAP[facility]ESS [facility]  Pedro Harper [id number]  Session Time: 9 AM - 2PM  Participation Level: Active  Behavioral Response: CasualAlertDepressed  Type of Therapy: Group Therapy; Psychotherapy; Psychoeducation; Activity Therapy  Treatment Goals addressed: Coping  Interventions: CBT, DBT, Supportive and Reframing  Summary:   9:00 - 10:30 Clinician led check-in regarding current stressors and situation, and review of patient completed daily inventory. Clinician utilized active listening and empathetic response and validated patient emotions. Clinician facilitated processing group on pertinent issues.  10:30 -12:00 Clinician led the group in completing and discussing the needs assessment. Patients identified and discussed areas of life where improvements need to be made.  12:00 - 12:45: Reflection group: Patients encouraged to practice skills and interpersonal techniques or work on mindfulness and relaxation techniques. The importance of self-care and making skills part of a routine to increase usage were stressed.  12:45 - 1:50 Clinician continued discussion about the needs assessment. Patients identified and discussed areas of life where improvements need to be made.  1:50 - 2:00 Clinician led check-out. Clinician assessed for immediate needs, medication compliance and efficacy, and safety concerns.    Suicidal/Homicidal: Nowithout intent/plan  Therapist Response: Pedro Harper is a 31 y.o. male who presents with depression symptoms. Patient arrived within time allowed and reports he is feeling "okay." Patient rates his mood at a 4 on a scale of 1-10 scale with 10 being great. Patient reports that he has been struggling with his personal and work life and feels this has increased his depression symptoms. Patient engaged in activity and discussion. Patient demonstrates some progress as evidenced by participating in his first session. Patient denies SI/HI/self-harm at the end  of group.   Plan: Patient will continue in PHP and medication management. Work towards decreasing depression symptoms and increase emotional regulation and positive coping skills.    Diagnosis: Severe recurrent major depression without psychotic features (HCC) [F33.2]    1. Severe recurrent major depression without psychotic features [facility](HCC)       Donia GuilesJenny Ronav Furney, LCSW 05/03/2017

## 2017-05-04 ENCOUNTER — Encounter (HOSPITAL_COMMUNITY): Payer: Self-pay

## 2017-05-04 ENCOUNTER — Other Ambulatory Visit (HOSPITAL_COMMUNITY): Payer: BLUE CROSS/BLUE SHIELD | Admitting: Licensed Clinical Social Worker

## 2017-05-04 VITALS — BP 110/72 | HR 84 | Ht 69.5 in | Wt 247.0 lb

## 2017-05-04 DIAGNOSIS — F332 Major depressive disorder, recurrent severe without psychotic features: Secondary | ICD-10-CM | POA: Diagnosis not present

## 2017-05-04 NOTE — Progress Notes (Signed)
Met with patient today as he presented with flat affect, depressed mood and denied any current suicidal or homicidal ideations, no auditory or visual hallucinations and reported he was currently on no medication for depression.  Patient reported he had separated from his wife, was staying with his parents for now as he did not want to be alone in his apartment and was still working.  Reports he works third shift currently at [employer] so he can attend group during the day and admits to being tired and a little sleepy today.  Patient scored his currently level of depression a 5, anxiety a 6 and hopelessness a 5 on a scale of 0-10 with 0 being none and 10 the worst he could manage.  Patient reported decreased appetite and that he came into the program after having thoughts of wanting to drive fast and to wreck his car following his wife's leaving.  Patient did not elaborate on what occurred for her to leave but admitted he had had similar thoughts 3 times in the past but never acted on these.  Patient denies any current danger to himself and reports his parents are driving him to [facility] and work as they do not think he should be driving considering recent thoughts and he is okay with this plan.  Patient is open to try medications for possible depression and he will discuss further with MD.  Patient reported group for the past 3 days was okay and he admitted to thinking it was helpful and will continue attending at this time.  Patient returned to group with plan to inform this nurse or PHP staff if any worsening of mood or symptoms.

## 2017-05-04 NOTE — Psych (Signed)
   [facility]  Greig RightShawn Parekh [id number]  Session Time: 9 AM - 2PM  Participation Level: Active  Behavioral Response: CasualAlertDepressed  Type of Therapy: Group Therapy; Psychotherapy; Psychoeducation; Activity Therapy  Treatment Goals addressed: Coping  Interventions: CBT, DBT, Supportive and Reframing  Summary:   9:00 - 10:30 Clinician led check-in regarding current stressors and situation, and review of patient completed daily inventory. Clinician utilized active listening and empathetic response and validated patient emotions. Clinician facilitated processing group on pertinent issues.  10:30 12:00: Clinician continued topic of "Cognitive Distortions" from the previous day. Pts continued to identify common cognitive distortions they often experience and ways to combat those distortions.  12:00 - 12:45 Reflection group: Patients encouraged to practice skills and interpersonal techniques or work on mindfulness and relaxation techniques. The importance of self-care and making skills part of a routine to increase usage were stressed.  12:45 - 1:50 Clinician continued with topic of "cognitive distortions." Group watched TedTalk "Don't Believe Everything You Think" and discussed how "ropes" and expectations we give ourselves can cloud thoughts, actions, and judgments. Patients identified areas of life where they have "ropes" and how they can work on them.  1:50 - 2:00 Clinician led check-out. Clinician assessed for immediate needs, medication compliance and efficacy, and safety concerns.    Suicidal/Homicidal: Nowithout intent/plan  Therapist Response: Greig RightShawn Luera is a 31 y.o. male who presents with depression symptoms. Patient arrived within time allowed and reports he is feeling "not great." Patient rates his mood at a 5 on a scale of 1-10 scale with 10 being great. Patient reports he got a dog for support from the pound yesterday.  Pt states he is hopeful that  the dog will help him.  Pt reports he is tired because he continues to work at night while attending group during the day.  Patient engaged in activity and discussion. Patient demonstrates some progress as evidenced by sharing more with the group. Patient denies SI/HI/self-harm at the end of group.   Plan: Patient will continue in PHP and medication management. Work towards decreasing depression symptoms and increase emotional regulation and positive coping skills.    Diagnosis: Severe recurrent major depression without psychotic features (HCC) [F33.2]    1. Severe recurrent major depression without psychotic features (HCC)     Quinn AxeWhitney J Jiles Goya, LPCA 05/04/2017

## 2017-05-05 ENCOUNTER — Other Ambulatory Visit (HOSPITAL_COMMUNITY): Payer: BLUE CROSS/BLUE SHIELD | Admitting: Licensed Clinical Social Worker

## 2017-05-05 ENCOUNTER — Other Ambulatory Visit (HOSPITAL_COMMUNITY): Payer: BLUE CROSS/BLUE SHIELD | Admitting: Specialist

## 2017-05-05 DIAGNOSIS — F332 Major depressive disorder, recurrent severe without psychotic features: Secondary | ICD-10-CM

## 2017-05-05 DIAGNOSIS — R4589 Other symptoms and signs involving emotional state: Secondary | ICD-10-CM

## 2017-05-05 MED ORDER — BUPROPION HCL ER (XL) 150 MG PO TB24: 150.0000 mg | ORAL_TABLET | ORAL | 2 refills | Status: AC

## 2017-05-05 NOTE — Psych (Signed)
   [facility]  Pedro Harper [id number]  Session Time: 9 AM - 2PM  Participation Level: Active  Behavioral Response: CasualAlertDepressed  Type of Therapy: Group Therapy; Psychotherapy; Psychoeducation; Activity Therapy, Occupational Therapy  Treatment Goals addressed: Coping  Interventions: CBT, DBT, Supportive and Reframing  Summary:   9:00 - 10:30 Clinician led check-in regarding current stressors and situation, and review of patient completed daily inventory. Clinician utilized active listening and empathetic response and validated patient emotions. Clinician facilitated processing group on pertinent issues.  10:30 -11:30: OT Group  11:30-12:15: Clinician reviewed styles of communication. Patients identified communication styles in short video clips. Patients identified their main form of communication and reasons why it works, or reasons to think about changing it. Group discussed examples of being assertive, passive aggressive, passive, and aggressive in the past 48 hours.  12:15 - 1:00: Reflection group: Patients encouraged to practice skills and interpersonal techniques or work on mindfulness and relaxation techniques. The importance of self-care and making skills part of a routine to increase usage were stressed.  1:00 - 1:50 Clinician continued with topic of "Communication". Clinician continued with topic of "I Statements". Patients identified real-life scenarios when I-statements could be useful. Patients practiced responding to situations using I Statements.  1:50 - 2:00 Clinician led check-out. Clinician assessed for immediate needs, medication compliance and efficacy, and safety concerns.   Suicidal/Homicidal: Nowithout intent/plan  Therapist Response: Pedro Harper is a 31 y.o. male who presents with depression symptoms. Patient arrived within time allowed and reports he is feeling "pretty good." Patient rates his mood at a 6 on a 1-10 scale with 10  being great. Patient reports he had a productive night at work. Patient said that before he went into work, he watched a [employer] speech his dad recommended him to watch and shared it inspired him to be productive. Pt was more interactive and spoke more in group today than any other day.  Patient engaged in activity and discussion. Patient demonstrates some progress as evidenced by stating that he is going to try to work on his assertiveness with his wife and being more active in group. Patient shared that him and his wife are not speaking at the moment. Patient denies SI/HI/self-harm at the end of group.     Plan: Patient will continue in PHP and medication management. Work towards decreasing depression symptoms and increase emotional regulation and positive coping skills.    Diagnosis: Severe recurrent major depression without psychotic features (HCC) [F33.2]    1. Severe recurrent major depression without psychotic features (HCC)     Quinn AxeWhitney J Danika Kluender, LPCA 05/05/2017

## 2017-05-05 NOTE — Psych (Signed)
   [facility]  Greig RightShawn Mccreadie [id number]  Session Time: 9 AM - 2PM  Participation Level: Active  Behavioral Response: CasualAlertDepressed  Type of Therapy: Group Therapy; Psychotherapy; Psychoeducation; Activity Therapy, Spiritual Care Group  Treatment Goals addressed: Coping  Interventions: CBT, DBT, Supportive and Reframing  Summary:   9:00 - 10:30 Clinician led check-in regarding current stressors and situation, and review of patient completed daily inventory. Clinician utilized active listening and empathetic response and validated patient emotions. Clinician facilitated processing group on pertinent issues.  10:30 -12:00 Spiritual care group  12:00 - 12:45 Reflection group: Patients encouraged to practice skills and interpersonal techniques or work on mindfulness and relaxation techniques. The importance of self-care and making skills part of a routine to increase usage were stressed.  12:45 - 1:50 Clinician introduced topic of Positive Psychology. Group watched "Positive Psychology" Ted-Talk. Patients discussed how their "lens" of life effects the way they feel. Patients identified two strategies they would be willing to try to change their "lens."  1:50 - 2:00 Clinician led check-out. Clinician assessed for immediate needs, medication compliance and efficacy, and safety concerns.    Suicidal/Homicidal: Nowithout intent/plan  Therapist Response: Greig RightShawn Juba is a 31 y.o. male who presents with depression symptoms. Patient arrived within time allowed and reports he is feeling "tired." Patient rates his mood at a 5 on a scale of 1-10 with 10 being great. Patient reports that he worked third shift last night and was feeling drowsy this morning. Patient shared that he went and got his new puppy yesterday after group which he is very excited about. Patient engaged in activity and discussion.  Pt identified writing 3 gratitudes daily for at least 21 days to help  with positive thinking. Patient demonstrates some progress as evidenced by stating that he is interested in learning more coping skills to help him manage his emotions better. Patient denies SI/HI/self-harm at the end of group.   Plan: Patient will continue in PHP and medication management. Work towards decreasing depression symptoms and increase emotional regulation and positive coping skills.    Diagnosis: Severe recurrent major depression without psychotic features (HCC) [F33.2]    1. Severe recurrent major depression without psychotic features (HCC)     Quinn AxeWhitney J Wylene Weissman, LPCA 05/05/2017

## 2017-05-05 NOTE — Progress Notes (Signed)
Progress note      Mr Pedro Harper said he had no wish one way or the other regarding starting an antidepressant.  He would defer to my recommendation.  I recommend starting buproprion because of the positive side effects and his depression seems to have been present before the current situational precipitants worsening the depression.  He said he still holds out hope that he and his wife can at least talk things out though she is not responding to overtures.  Her mother told him she would try to talk to her daughter.  Plan is to start buproprion XL 150 mg daily.

## 2017-05-06 ENCOUNTER — Other Ambulatory Visit (HOSPITAL_COMMUNITY): Payer: BLUE CROSS/BLUE SHIELD | Admitting: Licensed Clinical Social Worker

## 2017-05-06 ENCOUNTER — Encounter (HOSPITAL_COMMUNITY): Payer: Self-pay

## 2017-05-06 DIAGNOSIS — F332 Major depressive disorder, recurrent severe without psychotic features: Secondary | ICD-10-CM | POA: Diagnosis not present

## 2017-05-06 NOTE — Therapy (Signed)
[facility] [address] [place], [place], [address] Phone: [phone]   Fax:  [phone]  Occupational Therapy Evaluation  Patient Details  Name: Pedro Harper MRN: [id number] Date of Birth: 05/18/1986 Referring Provider: Dr. Carolanne GrumblingGerald Taylor    Encounter Date: 05/05/2017  OT End of Session - 05/05/17 1500    Visit Number  1    Number of Visits  6    Date for OT Re-Evaluation  05/27/17    Authorization Type  BC[employer]    OT Start Time  1030    OT Stop Time  1145    OT Time Calculation (min)  75 min       History reviewed. No pertinent past medical history.  History reviewed. No pertinent surgical history.  There were no vitals filed for this visit.  Subjective Assessment - 05/06/17 1459    Currently in Pain?  No/denies    Multiple Pain Sites  No        [facility] OT Assessment - 05/06/17 0001      Assessment   Diagnosis  Severe Depression    Referring Provider  Dr. Carolanne GrumblingGerald Taylor     Onset Date  -- chronic      Precautions   Precautions  None      Restrictions   Weight Bearing Restrictions  No      Balance Screen   Has the patient fallen in the past 6 months  No    Has the patient had a decrease in activity level because of a fear of falling?   No    Is the patient reluctant to leave their home because of a fear of falling?   No        OT assessment Diagnosis Major Recurring Depression Past medical history n/a Living situation married, seperated ADLs independent Work Mining engineerswing manager at [employer] Leisure unclear Social support family Struggles anxiety OT goal learn coping skills  General Causality Orientation Scale   Subscore Percentile Score  Autonomy 62 77.62  Control 58 70.82  Impersonal 41 25.07   Motivation Type  Motivation type Explanation  o Autonomy-oriented The individual is clear about what he or she is doing.  There is clear connection between behavior and interest/personal goals.   Motivation is intact.  o    o    o    Assessment:  Patient demonstrates autonomy oriented motivation type.  Patient will benefit from occupational therapy intervention in order to improve time management, financial management, stress management, job readiness skills, social skills,sleep hygiene, exercise and healthy eating habits,  and health management skills and other psychosocial skills needed for preparation to return to full time community living and to be a productive community member.   Plan:  Patient will participate in skilled occupational therapy sessions individually or in a group setting to improve coping skills, psychosocial skills, and emotional skills required to return to prior level of function as a productive community member. Treatment will be 1-2 times per week for 2-6 weeks.      S:  I just say no.  I dont give a reason. O:  Patient participated in assertiveness training group this date.  Patient was educated on definition of passive, assertive, and aggressive communication.  Benefits and drawbacks of each communication style, nonverbal communication/body language associated with each communication style.  Additionally, group members were educated on assertive communication techniques to utilize in day to day conversation, such as broken record, free information, self-dsclosure, fogging,  agree with the truth, agree with the odds, agree in principle, negative assertion, and workable compromise.  The group discussed methods of conflict resolution, every person's bill of rights, and assertive ways to say no.  Group then participated in a role playing activity in which they applied assertive responses to real life scenarios that have occurred in their group mate's day to day lives.  Group concluded with a debriefing of the activity to determine if responses were assertive, aggressive, or passive.  A:  Patient participated in group this date.  Able to contribute to discussion on  assertiveness and formulate assertive statements during activity. P:  Participate in skilled group for financial management.                  OT Education - 05/06/17 1459    Education provided  Yes    Education Details  Patient educated on techniques for improving assertiveness    Person(s) Educated  Patient    Methods  Explanation    Comprehension  Verbalized understanding       OT Short Term Goals - 05/06/17 1502      OT SHORT TERM GOAL #1   Title  Patient will be educated on strategies to improve psychosocial skills needed to participate fully in all daily, work, and leisure activities.    Time  3    Period  Weeks    Status  New    Target Date  05/27/17      OT SHORT TERM GOAL #2   Title  Patient will be educated on a HEP and independent with implementation of HEP.    Time  3    Period  Weeks    Status  New      OT SHORT TERM GOAL #3   Title  Patient will independently apply psychosocial skills and coping mechanisms to her daily activities in order to function independently    Time  3    Period  Weeks    Status  New               Plan - 05/06/17 1500    Occupational performance deficits (Please refer to evaluation for details):  ADL's;IADL's;Rest and Sleep;Work;Social Participation;Leisure    Rehab Potential  Good    OT Frequency  2x / week    OT Duration  -- 3 weeks    OT Treatment/Interventions  Self-care/ADL training;Other (comment) pyschosocial skills and coping skills    Clinical Decision Making  Limited treatment options, no task modification necessary    Consulted and Agree with Plan of Care  Patient       Patient will benefit from skilled therapeutic intervention in order to improve the following deficits and impairments:  Other (comment)(decreased coping skills, decreased psychosocial skills)  Visit Diagnosis: Severe recurrent major depression without psychotic features (HCC)  Difficulty coping    Problem List Patient Active  Problem List   Diagnosis Date Noted  . Severe recurrent major depression without psychotic features (HCC) 05/02/2017    Class: Chronic    Shirlean MylarBethany H. Murray, MHA, OTR/L [phone]  05/06/2017, 3:06 PM  [facility] [address] [place], [place], [address] Phone: [phone]   Fax:  [phone]  Name: Pedro Harper MRN: [id number] Date of Birth: 04/10/1986

## 2017-05-09 ENCOUNTER — Other Ambulatory Visit (HOSPITAL_COMMUNITY): Payer: BLUE CROSS/BLUE SHIELD | Admitting: Licensed Clinical Social Worker

## 2017-05-09 DIAGNOSIS — F332 Major depressive disorder, recurrent severe without psychotic features: Secondary | ICD-10-CM | POA: Diagnosis not present

## 2017-05-09 NOTE — Psych (Signed)
   [facility]  Pedro Harper [id number]  Session Time: 9 AM - 2PM  Participation Level: Active  Behavioral Response: CasualAlertDepressed  Type of Therapy: Group Therapy; Psychotherapy; Psychoeducation; Activity Therapy, Medication group  Treatment Goals addressed: Coping   Interventions: CBT, DBT, Supportive and Reframing  Summary:   9:00 - 10:30: Pharmacist discussed medication with group and answered questions.  10:30 -11:30: Clinician led check-in regarding current stressors and situation, and review of patient completed daily inventory. Clinician utilized active listening and empathetic response and validated patient emotions. Clinician facilitated processing group on pertinent issues.  11:30 - 12:15: Clinician continued topic of communication. Cln did review of previously discussed topics and led education on "I" Statements. Group learned how to utilize "I" Statements and how to formulate them effectively.  12:15 - 1:00 Reflection group: Patients encouraged to practice skills and interpersonal techniques or work on mindfulness and relaxation techniques. The importance of self-care and making skills part of a routine to increase usage were stressed.  1:00 - 1:50 Cln introduced active listening. Group learned tenets of active listening and its purpose. Group discussed basic communication errors we typically fall into and how to resist making them.  1:50 - 2:00 Clinician led check-out. Clinician assessed for immediate needs, medication compliance and efficacy, and safety concerns.    Suicidal/Homicidal: Nowithout intent/plan  Therapist Response: Pedro RightShawn Olsen is a 31 y.o. male who presents with depression symptoms. Patient arrived within time allowed and reports he is feeling "not great." Patient rates his mood at a 3 on a 1-10 scale with 10 being great. Patient reports he had a bad weekend with work stress and anxiety regarding his marriage. Pt reports he had  racing thoughts regarding things he wants to say to his wife and is struggling with respecting her desire not to talk at this time. Pt reports he is writing down what he wants to say. Patient engaged in activity and discussion. Patient demonstrates some progress as evidenced by stating that he was feeling upset last night and effectively coped by playing video games and spending time with his dog.  Patient denies SI/HI/self-harm at the end of group.     Plan: Patient will continue in PHP and medication management. Work towards decreasing depression symptoms and increase emotional regulation and positive coping skills.    Diagnosis: Severe recurrent major depression without psychotic features (HCC) [F33.2]    1. Severe recurrent major depression without psychotic features [facility](HCC)     Donia GuilesJenny Deztiny Sarra, LCSW 05/09/2017

## 2017-05-09 NOTE — Psych (Signed)
   [facility]  Pedro Harper Amrhein [id number]  Session Time: 9 AM - 1PM  Participation Level: Active  Behavioral Response: CasualAlertDepressed  Type of Therapy: Group Therapy; Psychotherapy; Psychoeducation; Activity Therapy  Treatment Goals addressed: Coping  Interventions: CBT, DBT, Supportive and Reframing  Summary:   9:00 -10:30: Clinician led check-in regarding current stressors and situation, and review of patient completed daily inventory. Clinician utilized active listening and empathetic response and validated patient emotions. Clinician facilitated processing group on pertinent issues.  10:30 - 11:15: Clinician introduced topic of family and how familial relationships shape the way we view all other relationships. Group members discussed ways they have relationships that they can correlate back to family dynamics.  11:15 - 12:00: Clinician led genogram activity. Group members created family genogram with emphasis on relationships within family and discussed patterns they see and feelings regarding completing the activity.  12:00 - 12:50 Group viewed TED talk "100 days of Rejection" and cln led discussion on ways in which fear of rejection can hold us back.  12:50 - 1:00 Clinician led check-out. Clinician assessed for immediate needs, medication compliance and efficacy, and safety concerns.   Suicidal/Homicidal: Nowithout intent/plan  Therapist Response: Pedro Harper Fodge is a 31 y.o. male who presents with depression symptoms. Patient arrived within time allowed and reports he is feeling "fair." Patient rates his mood at a 6 on a 1-10 scale with 10 being great. Patient reports he had an okay afternoon, but slept a lot. Patient also shared that he had to go into work early this morning to handle an issue and felt annoyed by it. Patient engaged in activity and discussion. Patient demonstrates some evidence by stating ways he plans on working on his relationship with  his wife. Patient denies SI/HI/self-harm at the end of group.    Plan: Patient will continue in PHP and medication management. Work towards decreasing depression symptoms and increase emotional regulation and positive coping skills.    Diagnosis: Severe recurrent major depression without psychotic features (HCC) [F33.2]    1. Severe recurrent major depression without psychotic features [facility](HCC)     Donia GuilesJenny Maybel Dambrosio, LCSW 05/09/2017

## 2017-05-10 ENCOUNTER — Other Ambulatory Visit (HOSPITAL_COMMUNITY): Payer: BLUE CROSS/BLUE SHIELD | Admitting: Licensed Clinical Social Worker

## 2017-05-10 ENCOUNTER — Other Ambulatory Visit (HOSPITAL_COMMUNITY): Payer: BLUE CROSS/BLUE SHIELD | Admitting: Occupational Therapy

## 2017-05-10 DIAGNOSIS — F332 Major depressive disorder, recurrent severe without psychotic features: Secondary | ICD-10-CM

## 2017-05-10 DIAGNOSIS — R4589 Other symptoms and signs involving emotional state: Secondary | ICD-10-CM

## 2017-05-10 NOTE — Therapy (Signed)
[facility] [address] [place], [place], [address] Phone: [phone]   Fax:  [phone]  Occupational Therapy Treatment  Patient Details  Name: Pedro Harper MRN: [id number] Date of Birth: 09/25/1985 Referring Provider: Dr. Carolanne GrumblingGerald Taylor    Encounter Date: 05/10/2017  OT End of Session - 05/10/17 1317    Visit Number  2    Number of Visits  6    Date for OT Re-Evaluation  05/27/17    Authorization Type  BCBS    OT Start Time  1030    OT Stop Time  1130    OT Time Calculation (min)  60 min       No past medical history on file.  No past surgical history on file.  There were no vitals filed for this visit.  Subjective Assessment - 05/10/17 1317    Currently in Pain?  No/denies         [facility] - 05/10/17 1317      Assessment   Diagnosis  Severe Depression      Precautions   Precautions  None          OT Group: Financial Management  S: "I think I have good financial management." O:  Patient actively participated in the following skilled occupational therapy treatment session this date:             Financial management-Discussed the importance of good financial management in all  areas of life and importance of saving. Pt participated in group discussion regarding current methods of budgeting and if these are effective or ineffective. She participated in identifying the "big five" necessities for financial living (food, shelter, insurance, taxes, and Transportation). Pt participated in group budgeting activity in which teams were given an income scenario and asked to budget for a month taking into account multiple variables (house/car/insurance/daycare/phone/etc). Teams then shared their developed budget with the group and discussed. She participated in group discussion on ways to save and how to develop a budget and spending plan-including knowing what you are bringing in (income after taxes)  and knowing your fixed and variable expenses that will be going out and how to manage these areas within a budget.  A:  Patient participated in skilled occupational therapy group for financial management skills this date.  Patient was engaged and open to strategies introduced. Pt provided with Budget Binder 2018 for beginning budgeting.  P:  Continue participation in skilled occupational therapy groups 1-2 times per week for 2 weeks in order to gain the necessary skills needed to return to full time community living and learn effective coping strategies to be a productive community resident. Follow up on HEP for developing a budget or savings plan.      OT Short Term Goals - 05/10/17 1318      OT SHORT TERM GOAL #1   Title  Patient will be educated on strategies to improve psychosocial skills needed to participate fully in all daily, work, and leisure activities.    Time  3    Period  Weeks    Status  On-going      OT SHORT TERM GOAL #2   Title  Patient will be educated on a HEP and independent with implementation of HEP.    Time  3    Period  Weeks    Status  On-going      OT SHORT TERM GOAL #3   Title  Patient will independently apply psychosocial  skills and coping mechanisms to her daily activities in order to function independently    Time  3    Period  Weeks    Status  On-going                 Patient will benefit from skilled therapeutic intervention in order to improve the following deficits and impairments:  Other (comment)(decreased coping skills, decreased psychosocial skills)  Visit Diagnosis: Severe recurrent major depression without psychotic features (HCC)  Difficulty coping    Problem List Patient Active Problem List   Diagnosis Date Noted  . Severe recurrent major depression without psychotic features (HCC) 05/02/2017    Class: Chronic   Ezra SitesLeslie Shakedra Beam, OTR/L  [phone] 05/10/2017, 1:18 PM  [facility]  [facility] PROGRAM [address] [place], [place], [address] Phone: [phone]   Fax:  [phone]  Name: Pedro Harper MRN: [id number] Date of Birth: 02/02/1986

## 2017-05-10 NOTE — Progress Notes (Signed)
Progress note      Mr Fransico MichaelBrennan says he has episodes of anxiety and anger and has to stop what he is doing to calm himself down.  These episodes are all related to his wife and her absence.  He is talking to her mother who is supportive.  He blames himself completely and wants his wife's forgiveness which she is not willing to give at the moment.  He revealed his wife's name because her mother told him to do that and his wife was okay with that as well.  That was significant because I worked with his wife when she was a teen and I know her and her family who also happen to live in my neighborhood.  His wife's mother is very knowledgeable and supportive and is good to have in the equation.    Plan :  Continue buproprion.  Work in the group should help with the anxiety rather than starting a medication.  He will ask with help in was to block the unwanted thoughts until he can think them at a more convenient time.

## 2017-05-11 ENCOUNTER — Other Ambulatory Visit (HOSPITAL_COMMUNITY): Payer: BLUE CROSS/BLUE SHIELD | Admitting: Licensed Clinical Social Worker

## 2017-05-11 DIAGNOSIS — F332 Major depressive disorder, recurrent severe without psychotic features: Secondary | ICD-10-CM | POA: Diagnosis not present

## 2017-05-11 NOTE — Psych (Signed)
   [facility]  Pedro Harper [id number]  Session Time: 9 AM - 2PM  Participation Level: Active  Behavioral Response: CasualAlertDepressed  Type of Therapy: Group Therapy; Psychotherapy; Psychoeducation; Activity Therapy, OT  Treatment Goals addressed: Coping   Interventions: CBT, DBT, Supportive and Reframing  Summary:   9:00 - 10:30 Clinician led check-in regarding current stressors and situation, and review of patient completed daily inventory. Clinician utilized active listening and empathetic response and validated patient emotions. Clinician facilitated processing group on pertinent issues.  10:30 -11:30: OT Group  11:30 - 12:00: Clinician introduced topic of boundaries and discussed ways in which patients can identify that a boundary is needed.  12:00 - 12:45: Reflection group: Patients encouraged to practice skills and interpersonal techniques or work on mindfulness and relaxation techniques. The importance of self-care and making skills part of a routine to increase usage were stressed.  12:45 - 1:50 Clinician continued topic of "Boundaries." Clinician introduced topic of how to set and maintain boundaries. Patients discussed specific scenarios in life where boundaries need to be addressed and ways to address them.  1:50 - 2:00 Clinician led check-out. Clinician assessed for immediate needs, medication compliance and efficacy, and safety concerns.    Suicidal/Homicidal: Nowithout intent/plan  Therapist Response: Pedro RightShawn Lloyd is a 31 y.o. male who presents with depression symptoms. Patient arrived within time allowed and reports he is feeling "okay." Patient rates his mood at a 3 on a 1-10 scale with 10 being great. Patient reports he is feeling frustrated due to "crap at work." Pt states that he consistently gets calls from work and he is asked to do more than is his job. Pt is working 3rd shift this week so he is also tired. Patient engaged in activity  and discussion. Pt identifies as having primarily rigid boundaries and states he needs most improvement on time boundaries. Patient demonstrates some progress as evidenced by increased openness in group. Patient denies SI/HI/self-harm at the end of group.     Plan: Patient will continue in PHP and medication management. Work towards decreasing depression symptoms and increase emotional regulation and positive coping skills.    Diagnosis: Severe recurrent major depression without psychotic features (HCC) [F33.2]    1. Severe recurrent major depression without psychotic features [facility](HCC)     Donia GuilesJenny Janari Yamada, LCSW 05/11/2017

## 2017-05-12 ENCOUNTER — Other Ambulatory Visit (HOSPITAL_COMMUNITY): Payer: BLUE CROSS/BLUE SHIELD | Admitting: Licensed Clinical Social Worker

## 2017-05-12 ENCOUNTER — Other Ambulatory Visit (HOSPITAL_COMMUNITY): Payer: BLUE CROSS/BLUE SHIELD | Admitting: Specialist

## 2017-05-12 DIAGNOSIS — F332 Major depressive disorder, recurrent severe without psychotic features: Secondary | ICD-10-CM

## 2017-05-12 DIAGNOSIS — R4589 Other symptoms and signs involving emotional state: Secondary | ICD-10-CM

## 2017-05-12 NOTE — Psych (Signed)
   [facility]  Pedro RightShawn Harper [id number]  Session Time: 9 AM - 2PM  Participation Level: Active  Behavioral Response: CasualAlertDepressed  Type of Therapy: Group Therapy; Psychotherapy; Psychoeducation; Activity Therapy, Spiritual Care  Treatment Goals addressed: Coping   Interventions: CBT, DBT, Supportive and Reframing  Summary:   9:00 - 10:30 Clinician led check-in regarding current stressors and situation, and review of patient completed daily inventory. Clinician utilized active listening and empathetic response and validated patient emotions. Clinician facilitated processing group on pertinent issues.  10:30 -12:00 Spiritual care group  12:00 - 12:45 Reflection group: Patients encouraged to practice skills and interpersonal techniques or work on mindfulness and relaxation techniques. The importance of self-care and making skills part of a routine to increase usage were stressed.  12:45 - 1:50 Relaxation group: Cln Forde RadonLeanne Yates led yoga group focused on retraining the body's response to stress.  1:50 - 2:00 Clinician led check-out. Clinician assessed for immediate needs, medication compliance and efficacy, and safety concerns.     Suicidal/Homicidal: Nowithout intent/plan  Therapist Response: Pedro RightShawn Harper is a 31 y.o. male who presents with depression symptoms. Patient arrived within time allowed and reports he is feeling "tired." Patient rates his mood at a 4 on a scale of 1-10 with 10 being great. Patient reports that he slept most of his afternoon due to having to work third shift the night before. Patient engaged in activity and discussion. Patient demonstrates some progress as evidenced by sharing ways he relates to listening to certain kinds of music to help distract himself when he is at his low. Patient denies SI/HI/self-harm at the end of group.     Plan: Patient will continue in PHP and medication management. Work towards decreasing depression  symptoms and increase emotional regulation and positive coping skills.    Diagnosis: Severe recurrent major depression without psychotic features (HCC) [F33.2]    1. Severe recurrent major depression without psychotic features [facility](HCC)     Pedro GuilesJenny Tajae Maiolo, LCSW 05/12/2017

## 2017-05-13 ENCOUNTER — Encounter (HOSPITAL_COMMUNITY): Payer: Self-pay

## 2017-05-13 ENCOUNTER — Other Ambulatory Visit (HOSPITAL_COMMUNITY): Payer: BLUE CROSS/BLUE SHIELD | Admitting: Licensed Clinical Social Worker

## 2017-05-13 VITALS — BP 116/72 | HR 77 | Ht 69.0 in | Wt 248.0 lb

## 2017-05-13 DIAGNOSIS — F332 Major depressive disorder, recurrent severe without psychotic features: Secondary | ICD-10-CM | POA: Diagnosis not present

## 2017-05-13 NOTE — Psych (Signed)
   [facility] THER[facility]  Pedro RightShawn Harper [id number]  Session Time: 9 AM - 2PM  Participation Level: Active  Behavioral Response: CasualAlertDepressed  Type of Therapy: Group Therapy; Psychotherapy; Psychoeducation; Activity Therapy, OT  Treatment Goals addressed: Coping   Interventions: CBT, DBT, Supportive and Reframing  Summary:   9:00 -10:30: Clinician led check-in regarding current stressors and situation, and review of patient completed daily inventory. Clinician utilized active listening and empathetic response and validated patient emotions. Clinician facilitated processing group on pertinent issues.  10:30 - 11:30: OT Group  11:30 - 12:15: Clinician led group in a discussion about healthy versus unhealthy coping.  12:15 - 1:00: Reflection group: Patients encouraged to practice skills and interpersonal techniques or work on mindfulness and relaxation techniques. The importance of self-care and making skills part of a routine to increase usage were stressed.  1:00-1:50: Clinician introduced topic of "Values and Goals". Group discussed why values and having goals are important and why it is helpful to know what values/goals are most important to self. Group discussed why certain values are more important to self than others. Group completed a goals worksheet to help narrow down steps to working towards a goal.  1:50 - 2:00 Clinician led check-out. Clinician assessed for immediate needs, medication compliance and efficacy, and safety concerns.    Suicidal/Homicidal: Nowithout intent/plan  Therapist Response: Pedro RightShawn Harper is a 31 y.o. male who presents with depression symptoms. Patient arrived within time allowed and reports that he is feeling "tired." Patient rates his mood at a 5 on a 1-10 scale with 10 being great. Patient reports that he had a boring night at work and had to attend a meeting after he got off. Patient engaged in activity and discussion. Patient  demonstrates some progress as evidenced by sharing with the other group members which skills work best for him when he is feeling depressed. Patient denies SI/HI/self-harm at the end of group.     Plan: Patient will continue in PHP and medication management. Work towards decreasing depression symptoms and increase emotional regulation and positive coping skills.    Diagnosis: Severe recurrent major depression without psychotic features (HCC) [F33.2]    1. Severe recurrent major depression without psychotic features [facility](HCC)     Donia GuilesJenny Jerl Munyan, LCSW 05/13/2017

## 2017-05-13 NOTE — Psych (Signed)
   [name] [id number]  Session Time: 9 AM - 1PM  Participation Level: Active  Behavioral Response: CasualAlertDepressed  Type of Therapy: Group Therapy; Psychotherapy; Psychoeducation; Activity Therapy  Treatment Goals addressed: Coping   Interventions: CBT, DBT, Supportive and Reframing  Summary:   9:00 - 10:30 Clinician led check-in regarding current stressors and situation, and review of patient completed daily inventory. Clinician utilized active listening and empathetic response and validated patient emotions. Clinician facilitated processing group on pertinent issues.  10:30 -11:30 Clinician introduced topic of "Mindfulness." Clinician showed "Don't try to be mindful" TedTalk. Group discussed the purpose of mindfulness, what it means that it is a skill, and the "What" and "How" skills.  11:30 - 12:50 Group continued with "Mindfulness" topic. Patients took part in mindfulness "workshop" in which they completed a number of activities to determine which form of practicing mindfulness is most effective for them.  12:50 - 1:00 Clinician led check-out. Clinician assessed for immediate needs, medication compliance and efficacy, and safety concerns.    Suicidal/Homicidal: Nowithout intent/plan  Therapist Response: Pedro Harper is a 31 y.o. male who presents with depression symptoms. Patient arrived within time allowed and reports that he is feeling "fine." Patient rates his mood at a 5 on a 1-10 scale with 10 being great. Patient reports that he continues to feel tired due to working third shift and his evening was uneventful. Patient engaged in activity and discussion. Patient demonstrates some progress as evidenced by maintaining stable mood. Patient denies SI/HI/self-harm at the end of group.    Plan: Patient will continue in PHP and medication management. Work towards decreasing depression symptoms and increase emotional regulation and  positive coping skills.    Diagnosis: Severe recurrent major depression without psychotic features (HCC) [F33.2]    1. Severe recurrent major depression without psychotic features [facility](HCC)     Donia GuilesJenny Valari Taylor, LCSW 05/13/2017

## 2017-05-13 NOTE — Progress Notes (Signed)
Patient presented with flat affect, depressed mood and rated his depression a 3-4, anxiety a 4-5, and hopelessness a 3 on a scale of 0-10 with 0 being none and 10 the worst he could experience.  Patient denied any suicidal or homicidal ideations, no auditory or visual hallucinations and reported a little anxious due to plans to meet with wife on 05/14/17 to discuss where they go from their current situation.  Patient stated he is still staying at his parents as does not want to return to apartment at this time and discussion with wife will be about what happens with placement, etc. Upon their meeting.  Patient reported still working each night and [facility] so only sleeping 4-6 hours a night but states this is typical for him.  States problems going to sleep at times and poor appetite but making himself eat and gained a pound over the previous week.  Patient scored an 11 on his PHQ9 depression screening today, down from 22 on 05/02/17 as admits to feeling better and no problems with current medication regimen.  Patient agreed to contact this nurse, PHP staff or [facility] or come to local ED if any worsening of symptoms, particularly since he admits to be a little anxious about meeting with wife over the coming weekend.  Patient reported no other symptoms or complaints and reported plan to continue taking medications daily.

## 2017-05-13 NOTE — Therapy (Signed)
[facility] [address] [place], [place], [address] Phone: [phone]   Fax:  [phone]  Occupational Therapy Treatment  Patient Details  Name: Pedro Harper MRN: [id number] Date of Birth: 01/08/1986 Referring Provider: Dr. Carolanne GrumblingGerald Taylor    Encounter Date: 05/12/2017  OT End of Session - 05/13/17 1317    Visit Number  3    Number of Visits  6    Date for OT Re-Evaluation  05/27/17    Authorization Type  BCBS    OT Start Time  1000    OT Stop Time  1100    OT Time Calculation (min)  60 min       History reviewed. No pertinent past medical history.  History reviewed. No pertinent surgical history.  There were no vitals filed for this visit.  Subjective Assessment - 05/13/17 1317    Currently in Pain?  No/denies             S:  Work gets in my way of time management - they dont leave me alone.  O:  Patient participated in skilled OT group focusing on time management this date.  Group consisted of warm up activity of how long is a minute, review of current daily schedules, pros and cons of good time management, and education on 10 strategies to improving time management.   A:  Patient was engaged in group, answered questions pointed at him, did not initiate conversation around the topic of time management. P:  Patient will identify one new time management strategy to utilize.  Participate in healthy sleep hygiene group.                OT Education - 05/13/17 1317    Education provided  Yes    Education Details  educated on 10 strategies for improved time management    Person(s) Educated  Patient    Methods  Explanation    Comprehension  Verbalized understanding       OT Short Term Goals - 05/10/17 1318      OT SHORT TERM GOAL #1   Title  Patient will be educated on strategies to improve psychosocial skills needed to participate fully in all daily, work, and leisure activities.    Time  3     Period  Weeks    Status  On-going      OT SHORT TERM GOAL #2   Title  Patient will be educated on a HEP and independent with implementation of HEP.    Time  3    Period  Weeks    Status  On-going      OT SHORT TERM GOAL #3   Title  Patient will independently apply psychosocial skills and coping mechanisms to her daily activities in order to function independently    Time  3    Period  Weeks    Status  On-going                 Patient will benefit from skilled therapeutic intervention in order to improve the following deficits and impairments:     Visit Diagnosis: Severe recurrent major depression without psychotic features (HCC)  Difficulty coping    Problem List Patient Active Problem List   Diagnosis Date Noted  . Severe recurrent major depression without psychotic features (HCC) 05/02/2017    Class: Chronic    Shirlean MylarBethany H. Murray, MHA, OTR/L [phone]  05/13/2017, 1:17 PM  [facility] PART[facility]  HOSPITALIZATION PROGRAM [address] [place], [place], [address] Phone: [phone]   Fax:  [phone]  Name: Pedro Harper MRN: [id number] Date of Birth: 05/07/1986

## 2017-05-16 ENCOUNTER — Other Ambulatory Visit (HOSPITAL_COMMUNITY): Payer: BLUE CROSS/BLUE SHIELD | Admitting: Licensed Clinical Social Worker

## 2017-05-16 DIAGNOSIS — F332 Major depressive disorder, recurrent severe without psychotic features: Secondary | ICD-10-CM

## 2017-05-17 ENCOUNTER — Other Ambulatory Visit (HOSPITAL_COMMUNITY): Payer: BLUE CROSS/BLUE SHIELD | Admitting: Licensed Clinical Social Worker

## 2017-05-17 ENCOUNTER — Other Ambulatory Visit (HOSPITAL_COMMUNITY): Payer: BLUE CROSS/BLUE SHIELD | Admitting: Occupational Therapy

## 2017-05-17 DIAGNOSIS — F332 Major depressive disorder, recurrent severe without psychotic features: Secondary | ICD-10-CM | POA: Diagnosis not present

## 2017-05-17 DIAGNOSIS — R4589 Other symptoms and signs involving emotional state: Secondary | ICD-10-CM

## 2017-05-17 NOTE — Psych (Signed)
  [facility] Psych Discharge Summary  Pedro Harper [id number]  Admission date: 05/02/2017 Discharge date: 05/17/2017  Reason for admission: depression  Progress in Program Toward Treatment Goals: attended and participated and says he feels much better.  The support of the group and the skills training were and are helpful he said.  The marriage is still unresolved but at least he and his wife are talking.  He is okay with leaving.  Progress (rationale): as outlined above  Discharge Plan: Referral to Psychiatrist and Referral to Counselor/Psychotherapist    Carolanne GrumblingGerald Taylor, MD 05/17/2017

## 2017-05-17 NOTE — Psych (Signed)
   [facility]  Greig RightShawn Cazier [id number]  Session Time: 9 AM - 2 PM  Participation Level: Active  Behavioral Response: CasualAlertDepressed  Type of Therapy: Group Therapy; Psychotherapy; Psychoeducation; Activity Therapy  Treatment Goals addressed: Coping   Interventions: CBT, DBT, Supportive and Reframing  Summary:   9:00 - 10:30 Clinician led check-in regarding current stressors and situation, and review of patient completed daily inventory. Clinician utilized active listening and empathetic response and validated patient emotions. Clinician facilitated processing group on pertinent issues.  10:30 -12:00 Clinician led psychoeducation group on distress tolerance. STOP, TIP, and ACCEPTS skills were introduced and patients discussed how to utilize it.  12:00 - 12:50 Reflection group: Patients encouraged to practice skills and interpersonal techniques or work on mindfulness and relaxation techniques. The importance of self-care and making skills part of a routine to increase usage were stressed.  12:50 - 1:50 Clinician continued topic of distress tolerance skill and patients completed activity on planning ahead and how they can use that when practicing skills.  1:50 - 2:00 Clinician led check-out. Clinician assessed for immediate needs, medication compliance and efficacy, and safety concern   Suicidal/Homicidal: Nowithout intent/plan  Therapist Response: Greig RightShawn Caracci is a 31 y.o. male who presents with depression symptoms. Patient arrived within time allowed and reports that he is feeling "pretty good." Patient rates his mood at a 7 on a 1-10 scale with 10 being great. Patient reports that his weekend was eventful. Pt shares he spoke to his wife and that it went "better than expected but not as good as I wanted." Pt states he had an altercation with a friend and they are no longer friends and pt is fine with it. Pt reports he also went to a movie with another friend.  Patient engaged in activity and discussion. Patient demonstrates some progress as evidenced by increased mood reported. Patient denies SI/HI/self-harm at the end of group.    Plan: Patient will continue in PHP and medication management. Work towards decreasing depression symptoms and increase emotional regulation and positive coping skills.    Diagnosis: Severe recurrent major depression without psychotic features (HCC) [F33.2]    1. Severe recurrent major depression without psychotic features [facility](HCC)     Donia GuilesJenny Ein Rijo, LCSW 05/17/2017

## 2017-05-17 NOTE — Therapy (Signed)
[facility] [place], [place], [address] Phone: [phone]   Fax:  [phone]  Occupational Therapy Treatment  Patient Details  Name: Craig Ionescu MRN: [id number] Date of Birth: 1985/12/25 Referring Provider: Dr. Donnelly Angelica    Encounter Date: 05/17/2017  OT End of Session - 05/17/17 1749    Visit Number  4    Number of Visits  6    Date for OT Re-Evaluation  05/27/17    Authorization Type  BCBS    OT Start Time  1030    OT Stop Time  1130    OT Time Calculation (min)  60 min       No past medical history on file.  No past surgical history on file.  There were no vitals filed for this visit.  Subjective Assessment - 05/17/17 1749    Currently in Pain?  No/denies         [facility] OT Assessment - 05/17/17 1749      Assessment   Diagnosis  Severe Depression      Precautions   Precautions  None          OT Treatment Group: Sleep Hygiene   S:  "I'm fine with my sleep." O:  Patient actively participated in the following skilled occupational therapy treatment session this date: Sleep hygiene - Began session with sleep hygiene questionnaire. Pt completed questionnaire and participated in discussion. Group then played Sleep Hygiene Taboo game, pt actively participated and engaged in game. Discussed importance of a routine leading up to bedtime, and educated on factors influencing sleep. Patient marginally engaged during session. Pt participated in group discussion of factors that interfere with sleep and lifestyle factors promoting sleep. Discussed importance of exercise for the body's metabolism and promoting healthy sleep, as well as foods that can help or hinder sleep.  A:  Patient participated in skilled occupational therapy group for sleep hygiene skills this date.  Patient was actively engaged during session and appears open to strategies introduced.  P:   Discharge               OT Short Term Goals - 05/17/17 1749      OT SHORT TERM GOAL #1   Title  Patient will be educated on strategies to improve psychosocial skills needed to participate fully in all daily, work, and leisure activities.    Time  3    Period  Weeks    Status  Achieved      OT SHORT TERM GOAL #2   Title  Patient will be educated on a HEP and independent with implementation of HEP.    Time  3    Period  Weeks    Status  Achieved      OT SHORT TERM GOAL #3   Title  Patient will independently apply psychosocial skills and coping mechanisms to her daily activities in order to function independently    Time  3    Period  Weeks    Status  Not Met                 Patient will benefit from skilled therapeutic intervention in order to improve the following deficits and impairments:  Other (comment)(decreased coping skills, decreased psychosocial skills)  Visit Diagnosis: Severe recurrent major depression without psychotic features ([place])  Difficulty coping    Problem List Patient Active Problem List   Diagnosis Date Noted  . Severe recurrent major depression without  psychotic features ([place]) 05/02/2017    Class: Chronic   Guadelupe Sabin, OTR/L  [phone] 05/17/2017, 5:50 PM  [facility] [place], [place], [address] Phone: [phone]   Fax:  [phone]  Name: Tilford Deaton MRN: [id number] Date of Birth: July 20, 1985    OCCUPATIONAL THERAPY DISCHARGE SUMMARY  Visits from Start of Care: 4  Current functional level related to goals / functional outcomes: Pt participated in OT groups focusing on sleep hygiene, time management, social and communication skills, and financial management. Pt was marginally engaged during sessions. Pt is discharging from [facility] program   Remaining deficits: Pt continues to have difficulty coping and implementing strategies provided.     Education / Equipment: Tips and strategies on the above mentioned areas Plan: Patient agrees to discharge.  Patient goals were partially met. Patient is being discharged due to being pleased with the current functional level.  ?????

## 2017-05-18 ENCOUNTER — Other Ambulatory Visit (HOSPITAL_COMMUNITY): Payer: Self-pay

## 2017-05-18 NOTE — Psych (Signed)
   [facility]  Pedro RightShawn Harper [id number]  Session Time: 9 AM - 2 PM  Participation Level: Active  Behavioral Response: CasualAlertDepressed  Type of Therapy: Group Therapy; Psychotherapy; Psychoeducation; Activity Therapy, OT  Treatment Goals addressed: Coping   Interventions: CBT, DBT, Supportive and Reframing  Summary:   9:00 - 10:30 Clinician led check-in regarding current stressors and situation, and review of patient completed daily inventory. Clinician utilized active listening and empathetic response and validated patient emotions. Clinician facilitated processing group on pertinent issues.  10:30 -11:30: OT Group  11:30-12:15: Clinician led psychoeducation group on grounding skills and how to utilize them.  12:15 - 1:00: Reflection group: Patients encouraged to practice skills and interpersonal techniques or work on mindfulness and relaxation techniques. The importance of self-care and making skills part of a routine to increase usage were stressed.  1:00 - 1:50 Clinician continued topic of "Distress Tolerance". Group discussed Self Soothe and how/when patients can employ these methods to help. Patients identified when these techniques may be helpful in their personal lives.  1:50 - 2:00 Clinician led check-out. Clinician assessed for immediate needs, medication compliance and efficacy, and safety concerns.   Suicidal/Homicidal: Nowithout intent/plan  Therapist Response: Pedro RightShawn [name] is a 31 y.o. male who presents with depression symptoms. Patient arrived within time allowed and reports that he is feeling "tired." Patient rates his mood at a 6 on a 1-10 scale with 10 being great. Patient reports that he was able to take his dog for a walk before he went into work. Patient shared he did not get any sleep because he worked third shift before coming into group. Patient engaged in activity and discussion. Patient demonstrates some progress as evidenced by  sharing ways he mentally grounds himself whenever he feels like his emotions are overwhelming him. Patient states that he uses humor to help ground his back into the present. Patient denies SI/HI/self-harm at the end of group.     Plan: Patient will discharge from PHP due to meeting treatment goals of decreased depression symptoms and increased distress tolerance skills. Pt demonstrates increased social activity and improvement in his relationships. Pt will step down to individual therapy and psychiatry within this agency. Pt and psychiatrist are aligned with discharge plan. Pt has follow up appointments for therapy with [name] 06/07/17 and psychiatry with [name] on 08/09/17. Pt denies SI/HI at time of discharge.    Diagnosis: Severe recurrent major depression without psychotic features (HCC) [F33.2]    1. Severe recurrent major depression without psychotic features [name](HCC)     Donia GuilesJenny Brexley Cutshaw, LCSW 05/18/2017

## 2017-05-19 ENCOUNTER — Other Ambulatory Visit (HOSPITAL_COMMUNITY): Payer: Self-pay

## 2017-05-19 ENCOUNTER — Ambulatory Visit (HOSPITAL_COMMUNITY): Payer: Self-pay

## 2017-05-20 ENCOUNTER — Other Ambulatory Visit (HOSPITAL_COMMUNITY): Payer: Self-pay

## 2017-05-23 ENCOUNTER — Other Ambulatory Visit (HOSPITAL_COMMUNITY): Payer: Self-pay

## 2017-05-24 ENCOUNTER — Other Ambulatory Visit (HOSPITAL_COMMUNITY): Payer: Self-pay

## 2017-05-24 ENCOUNTER — Ambulatory Visit (HOSPITAL_COMMUNITY): Payer: Self-pay

## 2017-05-25 ENCOUNTER — Other Ambulatory Visit (HOSPITAL_COMMUNITY): Payer: Self-pay

## 2017-05-26 ENCOUNTER — Other Ambulatory Visit (HOSPITAL_COMMUNITY): Payer: Self-pay

## 2017-05-26 ENCOUNTER — Ambulatory Visit (HOSPITAL_COMMUNITY): Payer: Self-pay

## 2017-05-27 ENCOUNTER — Other Ambulatory Visit (HOSPITAL_COMMUNITY): Payer: Self-pay

## 2017-06-07 ENCOUNTER — Ambulatory Visit (INDEPENDENT_AMBULATORY_CARE_PROVIDER_SITE_OTHER): Payer: BLUE CROSS/BLUE SHIELD | Admitting: Licensed Clinical Social Worker

## 2017-06-07 ENCOUNTER — Encounter (HOSPITAL_COMMUNITY): Payer: Self-pay | Admitting: Licensed Clinical Social Worker

## 2017-06-07 DIAGNOSIS — F332 Major depressive disorder, recurrent severe without psychotic features: Secondary | ICD-10-CM

## 2017-06-07 NOTE — Progress Notes (Signed)
   THERAPIST PROGRESS NOTE  Session Time: 2-3  Participation Level: Active  Behavioral Response: Casual and DisheveledAlertDepressed and Dysphoric  Type of Therapy: Individual Therapy  Treatment Goals addressed: Coping  Interventions: CBT, Motivational Interviewing and Supportive  Summary: Pedro Harper is a 31 y.o. male who presents with depression after a recent separation from his wife of 3854yrs. He states he is "mostly the same" since leaving PHP in this office a few weeks ago. Pt continues to report stress from work at [employer], dissatisfaction w/ his living situation w/ his parents (he wants to move back to his appt by himself, though his parents are encouraging him to give up the appt and live with them for an extended period). Pt referred to his partner as his "ex wife" and also his "wife" throughout session. Pt states his wife is saying "she does not want to deal w/ my drama anymore".   Counselor and pt discuss pt's time in PHP and coping skills that he acquired. Pt was encouraged to think of goals he might pursue in individual therapy. Pt was also told about weekly group therapy led by this writer on Wednesday nights at 5:30pm.  When asked what pt wanted out of therapy he states he wants to "get more comfortable w/ his new singleness and living alone". Pt states he has had passing SI when arguing w/ his wife recently but was able to "[facility]/ [facility]".   Pt was asked about his recent starting of Wellbutrin and pt replied he believes it is "workign as it should".   Suicidal/Homicidal: Yeswithout intent/plan  Therapist Response: Counselor assessed pt's level of functioning which appears to be low. Pt appears to lack any social suppport. He is disconnected from his siblings but is "adjusting to watching shows and playing games he used to like better". Counselor used direct and open questions to help pt identify his goals and needs from therapy.  Plan:  Return again in 3 weeks.  Diagnosis:    ICD-10-CM   1. Severe recurrent major depression without psychotic features [facility](HCC) F33.2        Margo CommonWesley E Swan, LCAS-A 06/07/2017

## 2017-06-30 ENCOUNTER — Ambulatory Visit (HOSPITAL_COMMUNITY): Payer: Self-pay | Admitting: Licensed Clinical Social Worker

## 2017-07-14 ENCOUNTER — Ambulatory Visit (INDEPENDENT_AMBULATORY_CARE_PROVIDER_SITE_OTHER): Payer: BLUE CROSS/BLUE SHIELD | Admitting: Licensed Clinical Social Worker

## 2017-07-14 DIAGNOSIS — F332 Major depressive disorder, recurrent severe without psychotic features: Secondary | ICD-10-CM

## 2017-07-15 ENCOUNTER — Encounter (HOSPITAL_COMMUNITY): Payer: Self-pay | Admitting: Licensed Clinical Social Worker

## 2017-07-15 NOTE — Progress Notes (Signed)
   THERAPIST PROGRESS NOTE  Session Time: 9-10  Participation Level: Active  Behavioral Response: CasualAlertAnxious and Dysphoric  Type of Therapy: Individual Therapy  Treatment Goals addressed: Anxiety and Diagnosis: MDD  Interventions: CBT, Strength-based and Supportive  Summary: Pedro Harper is a 32 y.o. male who presents for individual therapy after [facility]. Pt is moderately active in session and is only mildly verbal. Pt responds to open question w/ brief, one-word statements. He reports he has been "promoted to Social research officer, governmentstore manager" due to health problems w/ previous Production designer, theatre/television/filmmanager. Pt states his ex wife is still moving her stuff out of their shared apartment and pt is still residing w/ his parents and brother for now.  Counselor asks pt what he would like to work on in regards to his goal of "getting more comfortable w/ his singleness". Pt states he is staying home on his days off, isolating, and watching tv. Counselor encourages pt to create a routine or schedule that helps him stay active, engaged, and get out into society besides at work.   Pt plans to make a regularly scheduled outing w/ a single friend of his who likes movies.  Pt reports on his anxiety regarding his past mistakes he made w/ his wife and his job performance.  Suicidal/Homicidal: Nowithout intent/plan  Therapist Response: Counselor used empathic reflection, open questions, and supportive statements to help build rapport. Counselor used immediacy to reflect that pt "is not doing much talking and appears uncomfortable" to which pt responded he "enjoys being in therapy". Counselor helped pt to identify specific goals of "wanting to be more comfortable w/ new people and settings quicker".  Pt states medications "appear to be working effectively"  Plan: Return again in 2 weeks.    ICD-10-CM   1. Severe recurrent major depression without psychotic features [facility](HCC) F33.2        Pedro Harper,  LCAS-A 07/15/2017

## 2017-07-26 ENCOUNTER — Ambulatory Visit (HOSPITAL_COMMUNITY): Payer: Self-pay | Admitting: Licensed Clinical Social Worker

## 2017-08-09 ENCOUNTER — Ambulatory Visit (HOSPITAL_COMMUNITY): Payer: Self-pay | Admitting: Psychiatry

## 2017-08-11 ENCOUNTER — Ambulatory Visit (INDEPENDENT_AMBULATORY_CARE_PROVIDER_SITE_OTHER): Payer: BLUE CROSS/BLUE SHIELD | Admitting: Licensed Clinical Social Worker

## 2017-08-11 DIAGNOSIS — F332 Major depressive disorder, recurrent severe without psychotic features: Secondary | ICD-10-CM

## 2017-08-11 DIAGNOSIS — Z564 Discord with boss and workmates: Secondary | ICD-10-CM

## 2017-08-15 ENCOUNTER — Encounter (HOSPITAL_COMMUNITY): Payer: Self-pay | Admitting: Licensed Clinical Social Worker

## 2017-08-15 NOTE — Progress Notes (Signed)
   THERAPIST PROGRESS NOTE  Session Time: 9-10  Participation Level: Active  Behavioral Response: Casual and DisheveledDrowsyEuthymic  Type of Therapy: Individual Therapy  Treatment Goals addressed: Anxiety  Interventions: CBT and Supportive  Summary: Pedro Harper is a 32 y.o. male who presents with depression and anxiety related to recent separation and likely divorce from his wife of 3 yrs. 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   Pt discusses his feelings of "being angry that his exwife only contacts him when she needs something or when it benefits her". Pt states he feels hurt that she would not let him see his dog before separating and that she frequently bought things w/o telling him but would get angry at him for buying things he wanted w/o telling her first.   Counselor and pt discuss pt's lack of personal and financial boundaries w/ his exwife. Pt agrees he has poor boundaries and often lets other people take advantage of his kindness. Pt becomes somewhat tearful when discussing how his wife treats him currently.  Suicidal/Homicidal: Nowithout intent/plan  Therapist Response: Counselor worked to gain information on pt's experience of anxiety and depression. Helped pt gain insight into current feelings state. Continued to try to establish rapport. Supported pt and reflected on his stress level.  Plan: Return again in 2 weeks.  Diagnosis:    ICD-10-CM   1. Severe recurrent major depression without psychotic features [facility](HCC) F33.2        Margo CommonWesley E Swan, LCAS-A 08/15/2017

## 2017-08-23 ENCOUNTER — Ambulatory Visit (HOSPITAL_COMMUNITY): Payer: Self-pay | Admitting: Licensed Clinical Social Worker

## 2017-09-20 ENCOUNTER — Encounter (HOSPITAL_COMMUNITY): Payer: Self-pay | Admitting: Licensed Clinical Social Worker

## 2017-09-20 ENCOUNTER — Ambulatory Visit (INDEPENDENT_AMBULATORY_CARE_PROVIDER_SITE_OTHER): Payer: BLUE CROSS/BLUE SHIELD | Admitting: Licensed Clinical Social Worker

## 2017-09-20 DIAGNOSIS — F332 Major depressive disorder, recurrent severe without psychotic features: Secondary | ICD-10-CM | POA: Diagnosis not present

## 2017-09-20 NOTE — Progress Notes (Signed)
   THERAPIST PROGRESS NOTE  Session Time: 9-10  Participation Level: Active  Behavioral Response: Casual and Fairly GroomedAlertEuthymic  Type of Therapy: Individual Therapy  Treatment Goals addressed: Anxiety and Coping  Interventions: CBT, Solution Focused and Assertiveness Training  Summary: Pedro Harper is a 32 y.o. male who presents with anxiety and depression from recent separation from his wife. He states he made this appointment upon getting into an argument w/ his exwife over need for new phone lines 2 weeks ago. He states work is highly stressful due to high turnover rate and lack of Baristacoworker competence. He denies any significant changes in sxs and continues to take medications as px. He states he wants a place to "vent" and learn "new ways of looking at things".   "My employees lack intelligence and common sense about working w/ computers; many of them struggle w/ lack of motivation to work hard and that falls on me". "[employer] is always relying on me and it made my exwife mad when I had to go in on my days off".   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 "I try to find 1-2 hours per day to do things for myself".  "My divorce is still in process and my ex will not paperwork, I'm not sure why".   Counselor asks if pt has a hx of codependent bxs, and always needing to meet others' needs first. Pt agrees and discusses his long hx of "getting good feelings from focusing on helping others". Counselor and pt discuss how to find a balance b/w serving others and caring for self. Counselor encourages pt to read leadership and codependency books b/w sessions.   Suicidal/Homicidal: Nowithout intent/plan  Therapist Response: Counselor used open questions, and client-centered agenda setting. Counselor used CBT to challenge irrational beliefs, negative schemas, and people  pleasing bxs. Counselor assessed pt level of functioning and med compliance. Counselor recommended bibliotherapy: Reading "Codependent No More; and How to Win Friends and Influence People" to help improve pt's relationships and work life.  Plan: Return again TBD.  Diagnosis:    ICD-10-CM   1. Severe recurrent major depression without psychotic features [facility](HCC) F33.2        Margo CommonWesley E Swan, LCAS-A 09/20/2017

## 2017-10-10 ENCOUNTER — Ambulatory Visit (HOSPITAL_COMMUNITY): Payer: Self-pay | Admitting: Licensed Clinical Social Worker

## 2017-10-17 ENCOUNTER — Ambulatory Visit (HOSPITAL_COMMUNITY): Payer: BLUE CROSS/BLUE SHIELD | Admitting: Psychiatry

## 2017-11-03 ENCOUNTER — Ambulatory Visit (HOSPITAL_COMMUNITY): Payer: Self-pay | Admitting: Licensed Clinical Social Worker
